# Patient Record
Sex: Male | Born: 1980 | Race: Black or African American | Hispanic: No | Marital: Single | Smoking: Former smoker
Health system: Southern US, Community
[De-identification: ages and names within clinical notes are randomized; demographics above are authoritative.]

## PROBLEM LIST (undated history)

## (undated) DIAGNOSIS — F419 Anxiety disorder, unspecified: Secondary | ICD-10-CM

## (undated) DIAGNOSIS — G43909 Migraine, unspecified, not intractable, without status migrainosus: Secondary | ICD-10-CM

## (undated) DIAGNOSIS — I10 Essential (primary) hypertension: Secondary | ICD-10-CM

## (undated) HISTORY — PX: KNEE SURGERY: SHX244

## (undated) HISTORY — DX: Anxiety disorder, unspecified: F41.9

---

## 2008-05-26 HISTORY — PX: OTHER SURGICAL HISTORY: SHX169

## 2009-06-03 ENCOUNTER — Emergency Department (HOSPITAL_BASED_OUTPATIENT_CLINIC_OR_DEPARTMENT_OTHER): Admission: EM | Admit: 2009-06-03 | Discharge: 2009-06-03 | Payer: Self-pay | Admitting: Emergency Medicine

## 2009-06-03 ENCOUNTER — Ambulatory Visit: Payer: Self-pay | Admitting: Diagnostic Radiology

## 2009-10-07 ENCOUNTER — Emergency Department (HOSPITAL_BASED_OUTPATIENT_CLINIC_OR_DEPARTMENT_OTHER): Admission: EM | Admit: 2009-10-07 | Discharge: 2009-10-07 | Payer: Self-pay | Admitting: Emergency Medicine

## 2009-11-08 ENCOUNTER — Emergency Department (HOSPITAL_BASED_OUTPATIENT_CLINIC_OR_DEPARTMENT_OTHER): Admission: EM | Admit: 2009-11-08 | Discharge: 2009-11-08 | Payer: Self-pay | Admitting: Emergency Medicine

## 2010-04-23 ENCOUNTER — Emergency Department (HOSPITAL_COMMUNITY): Admission: EM | Admit: 2010-04-23 | Discharge: 2010-04-23 | Payer: Self-pay | Admitting: Emergency Medicine

## 2010-04-24 ENCOUNTER — Emergency Department (HOSPITAL_COMMUNITY)
Admission: EM | Admit: 2010-04-24 | Discharge: 2010-04-24 | Payer: Self-pay | Source: Home / Self Care | Admitting: Emergency Medicine

## 2010-08-07 LAB — BASIC METABOLIC PANEL
BUN: 12 mg/dL (ref 6–23)
CO2: 26 mEq/L (ref 19–32)
Chloride: 107 mEq/L (ref 96–112)
Glucose, Bld: 104 mg/dL — ABNORMAL HIGH (ref 70–99)
Potassium: 3.8 mEq/L (ref 3.5–5.1)

## 2010-08-07 LAB — DIFFERENTIAL
Band Neutrophils: 0 % (ref 0–10)
Blasts: 0 %
Eosinophils Absolute: 0.1 10*3/uL (ref 0.0–0.7)
Metamyelocytes Relative: 0 %
Monocytes Absolute: 0.6 10*3/uL (ref 0.1–1.0)
Monocytes Relative: 11 % (ref 3–12)

## 2010-08-07 LAB — CBC
HCT: 44.8 % (ref 39.0–52.0)
MCH: 26.6 pg (ref 26.0–34.0)
MCV: 80.7 fL (ref 78.0–100.0)
RDW: 14 % (ref 11.5–15.5)
WBC: 5.4 10*3/uL (ref 4.0–10.5)

## 2010-08-24 ENCOUNTER — Emergency Department (HOSPITAL_COMMUNITY)
Admission: EM | Admit: 2010-08-24 | Discharge: 2010-08-24 | Disposition: A | Discharge status: Home / Self Care | Payer: Self-pay | Attending: Emergency Medicine | Admitting: Emergency Medicine

## 2010-08-24 DIAGNOSIS — J3489 Other specified disorders of nose and nasal sinuses: Secondary | ICD-10-CM | POA: Insufficient documentation

## 2010-08-24 DIAGNOSIS — R51 Headache: Secondary | ICD-10-CM | POA: Insufficient documentation

## 2011-08-11 ENCOUNTER — Emergency Department (HOSPITAL_BASED_OUTPATIENT_CLINIC_OR_DEPARTMENT_OTHER)
Admission: EM | Admit: 2011-08-11 | Discharge: 2011-08-11 | Disposition: A | Discharge status: Home / Self Care | Payer: Self-pay | Attending: Emergency Medicine | Admitting: Emergency Medicine

## 2011-08-11 ENCOUNTER — Encounter (HOSPITAL_BASED_OUTPATIENT_CLINIC_OR_DEPARTMENT_OTHER): Payer: Self-pay | Admitting: *Deleted

## 2011-08-11 DIAGNOSIS — F17200 Nicotine dependence, unspecified, uncomplicated: Secondary | ICD-10-CM | POA: Insufficient documentation

## 2011-08-11 DIAGNOSIS — B349 Viral infection, unspecified: Secondary | ICD-10-CM

## 2011-08-11 DIAGNOSIS — R6883 Chills (without fever): Secondary | ICD-10-CM | POA: Insufficient documentation

## 2011-08-11 MED ORDER — IBUPROFEN 800 MG PO TABS: 800.0000 mg | ORAL_TABLET | Freq: Three times a day (TID) | ORAL | Status: AC

## 2011-08-11 MED ORDER — IBUPROFEN 800 MG PO TABS
800.0000 mg | ORAL_TABLET | Freq: Once | ORAL | Status: AC
  Administered 2011-08-11: 800 mg via ORAL
  Filled 2011-08-11: qty 1

## 2011-08-11 MED ORDER — OSELTAMIVIR PHOSPHATE 75 MG PO CAPS: 75.0000 mg | ORAL_CAPSULE | Freq: Two times a day (BID) | ORAL | Status: AC

## 2011-08-11 NOTE — ED Provider Notes (Signed)
History     CSN: [id number]  Arrival date & time 08/11/11  1210   First MD Initiated Contact with Patient 08/11/11 1309      Chief Complaint  Patient presents with  . Chills    (Consider location/radiation/quality/duration/timing/severity/associated sxs/prior treatment) Patient is a 31 y.o. male presenting with cough. The history is provided by the patient. No language interpreter was used.  Cough This is a new problem. The current episode started yesterday. The problem occurs constantly. The problem has been gradually worsening. The cough is non-productive. The maximum temperature recorded prior to his arrival was 100 to 100.9 F. The fever has been present for less than 1 day. Associated symptoms include chills. He has tried nothing for the symptoms. 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 He is not a smoker. His past medical history does not include pneumonia or asthma.  Pt complains of feeling achy and having chills.  Pt reports he has not felt well since Thursday but began having cough and chills yesterday.  Pt complains of aching  History reviewed. No pertinent past medical history.  Past Surgical History  Procedure Date  . Knee surgery     History reviewed. No pertinent family history.  History  Substance Use Topics  . Smoking status: Current Some Day Smoker  . Smokeless tobacco: Not on file  . Alcohol Use: Yes     occ      Review of Systems  Constitutional: Positive for chills.  Respiratory: Positive for cough.   All other systems reviewed and are negative.    Allergies  Review of patient's allergies indicates no known allergies.  Home Medications  No current outpatient prescriptions on file.  BP 136/96  Pulse 97  Temp(Src) 99.4 F (37.4 C) (Oral)  Resp 18  Ht 6\' 4"  (1.93 m)  Wt 230 lb (104.327 kg)  BMI 28.00 kg/m2  SpO2 100%  Physical Exam  Nursing note and vitals reviewed. Constitutional: He is oriented to person, place, and time. He appears  well-developed and well-nourished.  HENT:  Head: Normocephalic and atraumatic.  Right Ear: External ear normal.  Nose: Nose normal.  Mouth/Throat: Oropharynx is clear and moist.  Eyes: Conjunctivae and EOM are normal. Pupils are equal, round, and reactive to light.  Neck: Normal range of motion. Neck supple.  Cardiovascular: Normal rate and normal heart sounds.   Pulmonary/Chest: Effort normal.  Abdominal: Soft.  Musculoskeletal: Normal range of motion.  Neurological: He is alert and oriented to person, place, and time. He has normal reflexes.  Skin: Skin is warm.  Psychiatric: He has a normal mood and affect.    ED Course  Procedures (including critical care time)  Labs Reviewed - No data to display No results found.   No diagnosis found.    MDM  Pt counseled I think illness is viral.  Pt did not have a flu shot.  He does want tamiflu       Lonia Skinner [place], [place] 08/11/11 1424

## 2011-08-11 NOTE — ED Notes (Signed)
Patient c/o weakness, and generally not feeling well.  Starting on Friday, worse yesterday.

## 2011-08-11 NOTE — ED Provider Notes (Signed)
Medical screening examination/treatment/procedure(s) were performed by non-physician practitioner and as supervising physician I was immediately available for consultation/collaboration.   Rolan Bucco, MD 08/11/11 [phone]

## 2011-08-11 NOTE — Discharge Instructions (Signed)
Viremia Your exam shows you have a viral illness. Viremia means your symptoms are due to the presence of the virus in your blood. This will often cause a chill or sweat. Other common symptoms of viral infections include fever, muscle aches, headache, fatigue, stomach upsets, sore throat, and dry cough. Antibiotics are not effective in viral illnesses; they are usually only given when there is a secondary bacterial infection. General treatment includes bed rest, increasing oral fluid intake of clear, non-caffeinated drinks like ginger ale, fruit juices, water, or sports drinks. Medicines to relieve specific symptoms such as cough, pain, or diarrhea may also be prescribed. Only take over-the-counter or prescription medicines for pain, discomfort, or fever as directed by your caregiver.  Please call your doctor if you are not better after 2 to 3 days of symptom treatment. Call or return here right away if your illness gets more severe, or you develop any other new symptoms, such as a fever above 103 F (39.4 C), vomiting for more than a day, severe headache or other pain, stiff neck, trouble breathing, visual problems, "blackouts" or fainting. Document Released: 06/19/2004 Document Revised: 05/01/2011 Document Reviewed: 05/12/2005 [employer] Patient Information 2012 [employer]. 

## 2013-05-03 ENCOUNTER — Encounter (HOSPITAL_BASED_OUTPATIENT_CLINIC_OR_DEPARTMENT_OTHER): Payer: Self-pay | Admitting: Emergency Medicine

## 2013-05-03 ENCOUNTER — Emergency Department (HOSPITAL_BASED_OUTPATIENT_CLINIC_OR_DEPARTMENT_OTHER)
Admission: EM | Admit: 2013-05-03 | Discharge: 2013-05-03 | Disposition: A | Discharge status: Home / Self Care | Payer: BC Managed Care – PPO | Attending: Emergency Medicine | Admitting: Emergency Medicine

## 2013-05-03 DIAGNOSIS — F17200 Nicotine dependence, unspecified, uncomplicated: Secondary | ICD-10-CM | POA: Insufficient documentation

## 2013-05-03 DIAGNOSIS — H6690 Otitis media, unspecified, unspecified ear: Secondary | ICD-10-CM | POA: Insufficient documentation

## 2013-05-03 DIAGNOSIS — H6691 Otitis media, unspecified, right ear: Secondary | ICD-10-CM

## 2013-05-03 DIAGNOSIS — J3489 Other specified disorders of nose and nasal sinuses: Secondary | ICD-10-CM | POA: Insufficient documentation

## 2013-05-03 MED ORDER — AMOXICILLIN 500 MG PO CAPS: 500.0000 mg | ORAL_CAPSULE | Freq: Three times a day (TID) | ORAL | Status: DC

## 2013-05-03 NOTE — ED Provider Notes (Signed)
CSN: [id number]     Arrival date & time 05/03/13  1531 History   First MD Initiated Contact with Patient 05/03/13 1537     Chief Complaint  Patient presents with  . Otalgia   (Consider location/radiation/quality/duration/timing/severity/associated sxs/prior Treatment) HPI Comments: Patient is a 32 year old male presents with complaints of right ear discomfort for the past several days. He states that when he tilts his head to the side his headache gets worse in his hearing goes away. When he straightens his head back up his hearing returns. He states that he can feel something moving around inside of his ear. He reports a recent upper respiratory infection but denies fevers. He denies any visual changes, neck pain, high fever, or other symptoms.  Patient is a 32 y.o. male presenting with ear pain. The history is provided by the patient.  Otalgia Location:  Right Behind ear:  No abnormality Quality:  Pressure Severity:  Moderate Onset quality:  Gradual Duration:  4 days Timing:  Constant Progression:  Worsening Chronicity:  New Relieved by:  Nothing Worsened by:  Nothing tried Ineffective treatments:  None tried Associated symptoms: congestion, ear discharge and hearing loss   Associated symptoms: no fever     History reviewed. No pertinent past medical history. Past Surgical History  Procedure Laterality Date  . Knee surgery     No family history on file. History  Substance Use Topics  . Smoking status: Current Some Day Smoker  . Smokeless tobacco: Not on file  . Alcohol Use: Yes     Comment: occ    Review of Systems  Constitutional: Negative for fever.  HENT: Positive for congestion, ear discharge, ear pain and hearing loss.   All other systems reviewed and are negative.    Allergies  Review of patient's allergies indicates no known allergies.  Home Medications  No current outpatient prescriptions on file. BP 138/96  Pulse 65  Temp(Src) 99.1 F (37.3 C)  (Oral)  Resp 16  Ht 6\' 4"  (1.93 m)  Wt 230 lb (104.327 kg)  BMI 28.01 kg/m2  SpO2 100% Physical Exam  Nursing note and vitals reviewed. Constitutional: He is oriented to person, place, and time. He appears well-developed and well-nourished. No distress.  HENT:  Head: Normocephalic and atraumatic.  Mouth/Throat: Oropharynx is clear and moist.  The right tympanic membrane is noted to be erythematous with fluid in the middle ear.  Eyes: EOM are normal. Pupils are equal, round, and reactive to light.  Neck: Normal range of motion. Neck supple.  Cardiovascular: Normal rate, regular rhythm and normal heart sounds.   No murmur heard. Pulmonary/Chest: Effort normal and breath sounds normal. No respiratory distress. He has no wheezes.  Musculoskeletal: Normal range of motion. He exhibits no edema.  Neurological: He is alert and oriented to person, place, and time. No cranial nerve deficit. He exhibits normal muscle tone. Coordination normal.  Skin: Skin is warm and dry. He is not diaphoretic.    ED Course  Procedures (including critical care time) Labs Review Labs Reviewed - No data to display Imaging Review No results found.    MDM  No diagnosis found. This appears to be a mixed acute and serous otitis media. Will treat with amoxicillin and recommend decongestants.    Geoffery Lyons, MD 05/03/13 [phone]

## 2013-05-03 NOTE — ED Notes (Signed)
Headache and right ear pain.

## 2013-05-07 ENCOUNTER — Encounter (HOSPITAL_BASED_OUTPATIENT_CLINIC_OR_DEPARTMENT_OTHER): Payer: Self-pay | Admitting: Emergency Medicine

## 2013-05-07 ENCOUNTER — Emergency Department (HOSPITAL_BASED_OUTPATIENT_CLINIC_OR_DEPARTMENT_OTHER)
Admission: EM | Admit: 2013-05-07 | Discharge: 2013-05-07 | Disposition: A | Discharge status: Home / Self Care | Payer: BC Managed Care – PPO | Attending: Emergency Medicine | Admitting: Emergency Medicine

## 2013-05-07 DIAGNOSIS — Z8709 Personal history of other diseases of the respiratory system: Secondary | ICD-10-CM | POA: Insufficient documentation

## 2013-05-07 DIAGNOSIS — Z792 Long term (current) use of antibiotics: Secondary | ICD-10-CM | POA: Insufficient documentation

## 2013-05-07 DIAGNOSIS — F17200 Nicotine dependence, unspecified, uncomplicated: Secondary | ICD-10-CM | POA: Insufficient documentation

## 2013-05-07 DIAGNOSIS — G43909 Migraine, unspecified, not intractable, without status migrainosus: Secondary | ICD-10-CM

## 2013-05-07 MED ORDER — SODIUM CHLORIDE 0.9 % IV SOLN
INTRAVENOUS | Status: DC
  Administered 2013-05-07: 14:00:00 via INTRAVENOUS

## 2013-05-07 MED ORDER — DEXAMETHASONE SODIUM PHOSPHATE 10 MG/ML IJ SOLN
10.0000 mg | Freq: Once | INTRAMUSCULAR | Status: AC
  Administered 2013-05-07: 10 mg via INTRAVENOUS
  Filled 2013-05-07: qty 1

## 2013-05-07 MED ORDER — METOCLOPRAMIDE HCL 5 MG/ML IJ SOLN
10.0000 mg | Freq: Once | INTRAMUSCULAR | Status: AC
  Administered 2013-05-07: 10 mg via INTRAVENOUS
  Filled 2013-05-07: qty 2

## 2013-05-07 MED ORDER — DIPHENHYDRAMINE HCL 50 MG/ML IJ SOLN
25.0000 mg | Freq: Once | INTRAMUSCULAR | Status: AC
  Administered 2013-05-07: 25 mg via INTRAVENOUS
  Filled 2013-05-07: qty 1

## 2013-05-07 NOTE — ED Notes (Addendum)
Was given antibiotic to clear up infection behind ear drum, still taking antibiotics, pain behind ear gone, pain back or ear radiating to right eye, pt states h/o mastoiditis in past with similar headaches

## 2013-05-07 NOTE — ED Notes (Signed)
Patient here for 2nd visit of ongoing right sided headache. Patient was prescribed amoxicillin and taking with no relief. Denies nausea, no vision changes.

## 2013-05-07 NOTE — ED Notes (Signed)
D/c home- no new rx given 

## 2013-05-07 NOTE — ED Provider Notes (Signed)
Medical screening examination/treatment/procedure(s) were performed by non-physician practitioner and as supervising physician I was immediately available for consultation/collaboration.  Kathie Posa M Daelan Gatt, MD 05/07/13 2021 

## 2013-05-07 NOTE — ED Provider Notes (Signed)
CSN: [id number]     Arrival date & time 05/07/13  1203 History   First MD Initiated Contact with Patient 05/07/13 1318     Chief Complaint  Patient presents with  . Headache   (Consider location/radiation/quality/duration/timing/severity/associated sxs/prior Treatment) Patient is a 32 y.o. male presenting with migraines.  Migraine The problem occurs constantly. The problem has been unchanged. Associated symptoms include headaches, nausea and a visual change. Pertinent negatives include no abdominal pain, anorexia, chills, congestion, coughing, fever, myalgias, rash, sore throat or vomiting. He has tried NSAIDs, oral narcotics, rest, sleep and position changes for the symptoms. The treatment provided mild relief.   Ross Ryan is a 32 y.o. male who presents to the ED with right side headache. He was evaluated a few days ago and treated with Amoxicillin for sinusitis. Today the headache is on the right side of his head around his right eye. It feels similar to headaches he has had in the past with migraines. He denies neck pain or stiffness, fever or chills. He has nausea but no vomiting.    History reviewed. No pertinent past medical history. Past Surgical History  Procedure Laterality Date  . Knee surgery     No family history on file. History  Substance Use Topics  . Smoking status: Current Some Day Smoker  . Smokeless tobacco: Not on file  . Alcohol Use: Yes     Comment: occ    Review of Systems  Constitutional: Negative for fever and chills.  HENT: Negative for congestion and sore throat.   Eyes: Negative for photophobia and pain.  Respiratory: Negative for cough and shortness of breath.   Gastrointestinal: Positive for nausea. Negative for vomiting, abdominal pain and anorexia.  Genitourinary: Negative for dysuria.  Musculoskeletal: Negative for back pain and myalgias.  Skin: Negative for rash.  Allergic/Immunologic: Negative for immunocompromised state.  Neurological:  Positive for headaches. Negative for dizziness and light-headedness.  Psychiatric/Behavioral: The patient is not nervous/anxious.     Allergies  Review of patient's allergies indicates no known allergies.  Home Medications   Current Outpatient Rx  Name  Route  Sig  Dispense  Refill  . amoxicillin (AMOXIL) 500 MG capsule   Oral   Take 1 capsule (500 mg total) by mouth 3 (three) times daily.   21 capsule   0    BP 142/93  Pulse 68  Temp(Src) 98.2 F (36.8 C)  Resp 16  SpO2 100% Physical Exam  Nursing note and vitals reviewed. Constitutional: He is oriented to person, place, and time. He appears well-developed and well-nourished. No distress.  HENT:  Head: Normocephalic and atraumatic.  Right Ear: Tympanic membrane normal.  Left Ear: Tympanic membrane normal.  Nose: Nose normal.  Mouth/Throat: Uvula is midline, oropharynx is clear and moist and mucous membranes are normal.  Eyes: EOM are normal.  Neck: Neck supple.  Cardiovascular: Normal rate and regular rhythm.   Pulmonary/Chest: Effort normal and breath sounds normal.  Abdominal: Soft. There is no tenderness.  Musculoskeletal: Normal range of motion.  Pedal and radial pulses strong, adequate circulation, good touch sensation. Full range of motion.   Neurological: He is alert and oriented to person, place, and time. He has normal strength and normal reflexes. No cranial nerve deficit or sensory deficit. Coordination and gait normal.  Skin: Skin is warm and dry.  Psychiatric: He has a normal mood and affect. His behavior is normal. Judgment and thought content normal.     @ 1520 headache completely gone  after Decadron 10 mg, Reglan 10 mg and Benadryl 25 mg IV with Normal saline IV ED Course  Procedures   MDM  32 y.o. male with right side headache resolved with IV medications and IV fluids. Stable for discharge without any immediate complications. Normal neuro exam. Discussed with the patient clinical findings and plan  of care. All questioned fully answered. He will return if any problems arise.     Janne Napoleon, [place] 05/08/13 [phone]

## 2014-07-30 ENCOUNTER — Encounter (HOSPITAL_BASED_OUTPATIENT_CLINIC_OR_DEPARTMENT_OTHER): Payer: Self-pay | Admitting: *Deleted

## 2014-07-30 ENCOUNTER — Emergency Department (HOSPITAL_BASED_OUTPATIENT_CLINIC_OR_DEPARTMENT_OTHER)
Admission: EM | Admit: 2014-07-30 | Discharge: 2014-07-30 | Disposition: A | Discharge status: Home / Self Care | Payer: Self-pay | Attending: Emergency Medicine | Admitting: Emergency Medicine

## 2014-07-30 DIAGNOSIS — Z792 Long term (current) use of antibiotics: Secondary | ICD-10-CM | POA: Insufficient documentation

## 2014-07-30 DIAGNOSIS — K088 Other specified disorders of teeth and supporting structures: Secondary | ICD-10-CM | POA: Insufficient documentation

## 2014-07-30 DIAGNOSIS — Z720 Tobacco use: Secondary | ICD-10-CM | POA: Insufficient documentation

## 2014-07-30 DIAGNOSIS — K0889 Other specified disorders of teeth and supporting structures: Secondary | ICD-10-CM

## 2014-07-30 MED ORDER — BUPIVACAINE-EPINEPHRINE (PF) 0.5% -1:200000 IJ SOLN
1.8000 mL | Freq: Once | INTRAMUSCULAR | Status: AC
  Administered 2014-07-30: 1.8 mL
  Filled 2014-07-30: qty 1.8

## 2014-07-30 MED ORDER — NAPROXEN 500 MG PO TABS: 500.0000 mg | ORAL_TABLET | Freq: Two times a day (BID) | ORAL | Status: DC

## 2014-07-30 MED ORDER — PENICILLIN V POTASSIUM 500 MG PO TABS: 500.0000 mg | ORAL_TABLET | Freq: Four times a day (QID) | ORAL | Status: AC

## 2014-07-30 MED ORDER — HYDROCODONE-ACETAMINOPHEN 5-325 MG PO TABS: 1.0000 | ORAL_TABLET | ORAL | Status: DC | PRN

## 2014-07-30 NOTE — ED Notes (Signed)
NP at bedside for nerve block.  Supplies to bedside.

## 2014-07-30 NOTE — Discharge Instructions (Signed)
Please follow the directions provided. It is very important free to follow-up with the dentist for treatment of this sore tooth. Please take your antibiotics as directed until they are all gone. Please take the naproxen twice a day. You may take Vicodin for pain not relieved by the naproxen. Don't hesitate to return for any new, worsening, or concerning symptoms.    SEEK IMMEDIATE MEDICAL CARE IF:  You have a fever.  You develop redness and swelling of your face, jaw, or neck.  You are unable to open your mouth.  You have severe pain uncontrolled by pain medicine.

## 2014-07-30 NOTE — ED Notes (Addendum)
Patient states the left jaw has been swollen since yesterday, states he also has pain along his teeth on the bottom. States he has broken teeth on that side.

## 2014-07-30 NOTE — ED Provider Notes (Signed)
CSN: [id number]     Arrival date & time 07/30/14  1223 History   First MD Initiated Contact with Patient 07/30/14 1308     Chief Complaint  Patient presents with  . Facial Swelling   (Consider location/radiation/quality/duration/timing/severity/associated sxs/prior Treatment) HPI  Ross Ryan is a 34 year old male presenting with dental pain and facial swelling. He states a tooth on his left lower jaw began to hurt him about a month ago. He noticed he started having left-sided facial swelling that began yesterday. He rates his pain as 7 out of 10 and describes it as aching. He denies any trouble drinking fluids, fevers nausea or vomiting.     No past medical history on file. Past Surgical History  Procedure Laterality Date  . Knee surgery     No family history on file. History  Substance Use Topics  . Smoking status: Current Some Day Smoker  . Smokeless tobacco: Not on file  . Alcohol Use: Yes     Comment: occ    Review of Systems  Constitutional: Negative for fever.  HENT: Positive for dental problem.   Gastrointestinal: Negative for nausea and vomiting.      Allergies  Review of patient's allergies indicates no known allergies.  Home Medications   Prior to Admission medications   Medication Sig Start Date End Date Taking? Authorizing Provider  amoxicillin (AMOXIL) 500 MG capsule Take 1 capsule (500 mg total) by mouth 3 (three) times daily. 05/03/13   Geoffery Lyonsouglas Delo, MD   BP 135/90 mmHg  Pulse 80  Temp(Src) 98.8 F (37.1 C) (Oral)  Resp 16  Ht 6\' 4"  (1.93 m)  Wt 210 lb (95.255 kg)  BMI 25.57 kg/m2  SpO2 100% Physical Exam  Constitutional: He appears well-developed and well-nourished. No distress.  HENT:  Head: Normocephalic and atraumatic.  Mouth/Throat:    Tenderness to left lower molar. Mild facial swelling to soft tissues of left lower jaw, but no abscess noted, not trismus and no oral floor swelling.   Eyes: Conjunctivae are normal. Right eye  exhibits no discharge. Left eye exhibits no discharge. No scleral icterus.  Cardiovascular: Intact distal pulses.   Pulmonary/Chest: Effort normal.  Neurological: He is alert. Coordination normal.  Skin: He is not diaphoretic.  Nursing note and vitals reviewed.   ED Course  Procedures (including critical care time) NERVE BLOCK Performed by: Harle Battiestysinger, Scherry Laverne Consent: Verbal consent obtained. Required items: required blood products, implants, devices, and special equipment available Time out: Immediately prior to procedure a "time out" was called to verify the correct patient, procedure, equipment, support staff and site/side marked as required.  Indication: dental pain Nerve block body site: inferior alveolar nerve  Preparation: Patient was prepped and draped in the usual sterile fashion. Needle gauge: 27 G Location technique: anatomical landmarks  Local anesthetic: bupivicaine with epi  Anesthetic total: 1.8 ml  Outcome: pain improved Patient tolerance: Patient tolerated the procedure well with no immediate complications.   Labs Review Labs Reviewed - No data to display  Imaging Review No results found.   EKG Interpretation None      MDM   Final diagnoses:  Pain, dental   34 yo with toothache but no signs of deep space infection or gross abscess. Pain resolved after dental block. Prescription for penicillin and pain medicine provided.  Resources provided to establish care with a  Dentist for definitive treatment.     Filed Vitals:   07/30/14 1232 07/30/14 1432  BP: 135/90 140/80  Pulse: 80  72  Temp: 98.8 F (37.1 C)   TempSrc: Oral   Resp: 16 16  Height:  (1.93 m)   Weight: 210 lb (95.255 kg)   SpO2: 100% 100%   Meds given in ED:  Medications  bupivacaine-epinephrine (MARCAINE W/ EPI) 0.5% -1:200000 injection 1.8 mL (1.8 mLs Infiltration Given by Other 07/30/14 1409)    Discharge Medication List as of 07/30/2014  2:24 PM    START taking these  medications   Details  HYDROcodone-acetaminophen (NORCO/VICODIN) 5-325 MG per tablet Take 1 tablet by mouth every 4 (four) hours as needed., Starting 07/30/2014, Until Discontinued, Print    naproxen (NAPROSYN) 500 MG tablet Take 1 tablet (500 mg total) by mouth 2 (two) times daily., Starting 07/30/2014, Until Discontinued, Print    penicillin v potassium (VEETID) 500 MG tablet Take 1 tablet (500 mg total) by mouth 4 (four) times daily., Starting 07/30/2014, Until Sun 08/06/14, Print           Harle Battiest, NP 08/01/14 1610  Geoffery Lyons, MD 08/03/14 [phone]

## 2015-01-11 ENCOUNTER — Encounter (HOSPITAL_BASED_OUTPATIENT_CLINIC_OR_DEPARTMENT_OTHER): Payer: Self-pay | Admitting: *Deleted

## 2015-01-11 ENCOUNTER — Emergency Department (HOSPITAL_BASED_OUTPATIENT_CLINIC_OR_DEPARTMENT_OTHER)
Admission: EM | Admit: 2015-01-11 | Discharge: 2015-01-12 | Disposition: A | Discharge status: Home / Self Care | Payer: Self-pay | Attending: Emergency Medicine | Admitting: Emergency Medicine

## 2015-01-11 DIAGNOSIS — R21 Rash and other nonspecific skin eruption: Secondary | ICD-10-CM | POA: Insufficient documentation

## 2015-01-11 DIAGNOSIS — Z720 Tobacco use: Secondary | ICD-10-CM | POA: Insufficient documentation

## 2015-01-11 DIAGNOSIS — Z872 Personal history of diseases of the skin and subcutaneous tissue: Secondary | ICD-10-CM | POA: Insufficient documentation

## 2015-01-11 DIAGNOSIS — Z791 Long term (current) use of non-steroidal anti-inflammatories (NSAID): Secondary | ICD-10-CM | POA: Insufficient documentation

## 2015-01-11 DIAGNOSIS — Z792 Long term (current) use of antibiotics: Secondary | ICD-10-CM | POA: Insufficient documentation

## 2015-01-11 MED ORDER — PREDNISONE 50 MG PO TABS
60.0000 mg | ORAL_TABLET | Freq: Once | ORAL | Status: AC
  Administered 2015-01-12: 60 mg via ORAL
  Filled 2015-01-11 (×2): qty 1

## 2015-01-11 MED ORDER — DIPHENHYDRAMINE HCL 25 MG PO CAPS
50.0000 mg | ORAL_CAPSULE | Freq: Once | ORAL | Status: AC
  Administered 2015-01-12: 50 mg via ORAL
  Filled 2015-01-11: qty 2

## 2015-01-11 NOTE — ED Provider Notes (Signed)
This chart was scribed for Layla Maw Heraclio Seidman, DO by Phillis Haggis, ED Scribe. This patient was seen in room MH05/MH05 and patient care was started at 11:50 PM.   TIME SEEN: 11:50 PM  CHIEF COMPLAINT: allergic reaction  HPI:  Ross Ryan is a 34 y.o. male who presents to the Emergency Department complaining of diffuse pruritic rash that started yesterday. States that 2 days ago he began feeling "hot and cold", tired. He states he has a history of eczema and noticed that his eczema on his right lateral leg was getting worse. He thought this could be an infection so he started taking amoxicillin that he had leftover from her previous visit for dental pain.  States he has taken penicillin in the past without difficulty. Denies any lip or tongue swelling. No wheezing or shortness of breath. No lightheadedness. No other new soaps, lotions, detergents or medications. He has not measured his temperature at home. Afebrile in the emergency department and did not take antibiotics prior to arrival. Denies cough, sore throat, ear pain, vomiting, diarrhea. No tick bite. No recent travel or sick contact.   ROS: See HPI Constitutional:  Subjective fever  Eyes: no drainage  ENT: no runny nose   Cardiovascular:  no chest pain  Resp: no SOB  GI: no vomiting GU: no dysuria Integumentary:rash  Allergy: hives  Musculoskeletal: no leg swelling  Neurological: no slurred speech ROS otherwise negative  PAST MEDICAL HISTORY/PAST SURGICAL HISTORY:  History reviewed. No pertinent past medical history.  MEDICATIONS:  Prior to Admission medications   Medication Sig Start Date End Date Taking? Authorizing Provider  amoxicillin (AMOXIL) 500 MG capsule Take 1 capsule (500 mg total) by mouth 3 (three) times daily. 05/03/13   Geoffery Lyons, MD  HYDROcodone-acetaminophen (NORCO/VICODIN) 5-325 MG per tablet Take 1 tablet by mouth every 4 (four) hours as needed. 07/30/14   Harle Battiest, NP  naproxen (NAPROSYN) 500 MG  tablet Take 1 tablet (500 mg total) by mouth 2 (two) times daily. 07/30/14   Harle Battiest, NP    ALLERGIES:  No Known Allergies  SOCIAL HISTORY:  Social History  Substance Use Topics  . Smoking status: Current Some Day Smoker  . Smokeless tobacco: Not on file  . Alcohol Use: Yes     Comment: occ    FAMILY HISTORY: No family history on file.  EXAM: BP 143/86 mmHg  Pulse 86  Temp(Src) 99.6 F (37.6 C) (Oral)  Resp 20  Ht 6\' 4"  (1.93 m)  Wt 220 lb (99.791 kg)  BMI 26.79 kg/m2  SpO2 100%  CONSTITUTIONAL: Alert and oriented and responds appropriately to questions. Well-appearing; well-nourished, afebrile, nontoxic, in no distress HEAD: Normocephalic EYES: Conjunctivae clear, PERRL ENT: normal nose; no rhinorrhea; moist mucous membranes; pharynx without lesions noted or tonsillar hypertrophy or exudate, no uvular deviation, no angioedema, no Ludwig's angina, no sign of dental abscess, normal phonation, no stridor, swallowing his secretions without difficulty NECK: Supple, no meningismus, no LAD  CARD: RRR; S1 and S2 appreciated; no murmurs, no clicks, no rubs, no gallops RESP: Normal chest excursion without splinting or tachypnea; breath sounds clear and equal bilaterally; no wheezes, no rhonchi, no rales, no hypoxia or respiratory distress, speaking full sentences ABD/GI: Normal bowel sounds; non-distended; soft, non-tender, no rebound, no guarding, no peritoneal signs BACK:  The back appears normal and is non-tender to palpation, there is no CVA tenderness EXT: Normal ROM in all joints; non-tender to palpation; no edema; normal capillary refill; no cyanosis, no calf  tenderness or swelling    SKIN: Normal color for age and race; warm; diffuse erythematous scattered macular rash to his torso and extremities, no rash involving palms or mucus membranes, no petechiae or purpura, no desquamation or blisters, no hives; eczematous lesions to the right lateral ankle without sign of  superimposed infection, no fluctuance or induration NEURO: Moves all extremities equally, sensation to light touch intact diffusely, cranial nerves II through XII intact PSYCH: The patient's mood and manner are appropriate. Grooming and personal hygiene are appropriate.  MEDICAL DECISION MAKING: Patient here with likely drug rash. Have advised him to stop taking amoxicillin. He reports subjective fevers at home and not feeling well. No other associated symptoms. Doubt meningitis, pneumonia. Rash does not appear to be infectious in nature. No tick bite. No signs of life-threatening rash. We'll discharge home on steroid taper and have advised him to use Benadryl as needed for itching. Discussed usual and customary return precautions. He verbalizes understanding and is comfortable with plan.    I personally performed the services described in this documentation, which was scribed in my presence. The recorded information has been reviewed and is accurate.     Layla Maw Duchess Armendarez, DO 01/12/15 [phone]

## 2015-01-11 NOTE — ED Notes (Signed)
Pt was taking antibiotics for a rash (x2 doses)  Pt took 2 doses and isnt feeling well.  Pt states that he feels sweaty, weak, itching, hives.  No sob or swelling in mouth or throat at this time.  Pt did not take any Benadryl at home

## 2015-01-12 MED ORDER — PREDNISONE 10 MG PO TABS: 60.0000 mg | ORAL_TABLET | Freq: Every day | ORAL | Status: DC

## 2015-01-12 NOTE — Discharge Instructions (Signed)
I suspect that your rash is due to a drug allergy to amoxicillin. Please stop taking this medication. I recommend that you avoid penicillin in the future. Use over-the-counter Benadryl 50 mg every 6 hours over-the-counter. You may use this as needed for itching.  Rash A rash is a change in the color or texture of your skin. There are many different types of rashes. You may have other problems that accompany your rash. CAUSES   Infections.  Allergic reactions. This can include allergies to pets or foods.  Certain medicines.  Exposure to certain chemicals, soaps, or cosmetics.  Heat.  Exposure to poisonous plants.  Tumors, both cancerous and noncancerous. SYMPTOMS   Redness.  Scaly skin.  Itchy skin.  Dry or cracked skin.  Bumps.  Blisters.  Pain. DIAGNOSIS  Your caregiver may do a physical exam to determine what type of rash you have. A skin sample (biopsy) may be taken and examined under a microscope. TREATMENT  Treatment depends on the type of rash you have. Your caregiver may prescribe certain medicines. For serious conditions, you may need to see a skin doctor (dermatologist). HOME CARE INSTRUCTIONS   Avoid the substance that caused your rash.  Do not scratch your rash. This can cause infection.  You may take cool baths to help stop itching.  Only take over-the-counter or prescription medicines as directed by your caregiver.  Keep all follow-up appointments as directed by your caregiver. SEEK IMMEDIATE MEDICAL CARE IF:  You have increasing pain, swelling, or redness.  You have a fever.  You have new or severe symptoms.  You have body aches, diarrhea, or vomiting.  Your rash is not better after 3 days. MAKE SURE YOU:  Understand these instructions.  Will watch your condition.  Will get help right away if you are not doing well or get worse. Document Released: 05/02/2002 Document Revised: 08/04/2011 Document Reviewed: 02/24/2011 [facility]  Patient Information 2015 [place], [place]. This information is not intended to replace advice given to you by your health care provider. Make sure you discuss any questions you have with your health care provider.  Drug Allergy A drug allergy means you have a strange reaction to a medicine. You may have puffiness (swelling), itching, red rashes, and hives. Some allergic reactions can be life-threatening. HOME CARE  If you do not know what caused your reaction:  Write down medicines you use.  Write down any problems you have after using medicine.  Avoid things that cause a reaction.  You can see an allergy doctor to be tested for allergies. If you have hives or a rash:  Take medicine as told by your doctor.  Place cold cloths on your skin.  Do not take hot baths or hot showers. Take baths in cool water. If you are severely allergic:  Wear a medical bracelet or necklace that lists your allergy.  Carry your allergy kit or medicine shot to treat severe allergic reactions with you. These can save your life.  Do not drive until medicine from your shot has worn off, unless your doctor says it is okay. GET HELP RIGHT AWAY IF:   Your mouth is puffy, or you have trouble breathing.  You have a tight feeling in your chest or throat.  You have hives, puffiness, or itching all over your body.  You throw up (vomit) or have watery poop (diarrhea).  You feel dizzy or pass out (faint).  You think you are having a reaction. Problems often start within 30 minutes  after taking a medicine.  You are getting worse, not better.  You have new problems.  Your problems go away and then come back. This is an emergency. Use your medicine shot or allergy kit as told. Call yourlocal emergency services (911 in U.S.) after the shot. Even if you feel better after the shot, you need to go to the hospital. You may need more medicine to control a severe reaction. MAKE SURE YOU:  Understand these  instructions.  Will watch your condition.  Will get help right away if you are not doing well or get worse. Document Released: 06/19/2004 Document Revised: 08/04/2011 Document Reviewed: 11/07/2010 [facility] Patient Information 2015 [place], [place]. This information is not intended to replace advice given to you by your health care provider. Make sure you discuss any questions you have with your health care provider.

## 2015-10-07 ENCOUNTER — Encounter (HOSPITAL_BASED_OUTPATIENT_CLINIC_OR_DEPARTMENT_OTHER): Payer: Self-pay | Admitting: Emergency Medicine

## 2015-10-07 ENCOUNTER — Emergency Department (HOSPITAL_BASED_OUTPATIENT_CLINIC_OR_DEPARTMENT_OTHER)
Admission: EM | Admit: 2015-10-07 | Discharge: 2015-10-07 | Disposition: A | Discharge status: Home / Self Care | Payer: Self-pay | Attending: Emergency Medicine | Admitting: Emergency Medicine

## 2015-10-07 DIAGNOSIS — F17200 Nicotine dependence, unspecified, uncomplicated: Secondary | ICD-10-CM | POA: Insufficient documentation

## 2015-10-07 DIAGNOSIS — L989 Disorder of the skin and subcutaneous tissue, unspecified: Secondary | ICD-10-CM | POA: Insufficient documentation

## 2015-10-07 NOTE — ED Notes (Signed)
EDP into room 

## 2015-10-07 NOTE — ED Notes (Signed)
Patient reports that he had a "mole" to the back of his head, and it started to bleed earlier today and then has started again x 3 times. Bleeding is controlled at this time

## 2015-10-07 NOTE — ED Provider Notes (Signed)
CSN: [id number]     Arrival date & time 10/07/15  2001 History  By signing my name below, I, Bethel BornBritney McCollum, attest that this documentation has been prepared under the direction and in the presence of Melene Planan Aydien Majette, DO. Electronically Signed: Bethel BornBritney McCollum, ED Scribe. 10/07/2015. 9:17 PM   Chief Complaint  Patient presents with  . Recurrent Skin Infections    The history is provided by the patient. No language interpreter was used.   Georgian Coatrick R Taha is a 35 y.o. male who presents to the Emergency Department complaining of intermittent bleeding from a lesion at the posterior scalp with onset last night. Pt states that the area has been there for a few months and he thought it was a hair bump Since last night he has had 2 other episodes of bleeding. Pt reports that there was a significant amount of bleeding.  He has no pain at the affected area. There are no similar areas on his body.    History reviewed. No pertinent past medical history. Past Surgical History  Procedure Laterality Date  . Knee surgery     History reviewed. No pertinent family history. Social History  Substance Use Topics  . Smoking status: Current Some Day Smoker  . Smokeless tobacco: None  . Alcohol Use: Yes     Comment: occ    Review of Systems  Constitutional: Negative for fever and chills.  HENT: Negative for congestion and facial swelling.   Eyes: Negative for discharge and visual disturbance.  Respiratory: Negative for shortness of breath.   Cardiovascular: Negative for chest pain and palpitations.  Gastrointestinal: Negative for vomiting, abdominal pain and diarrhea.  Musculoskeletal: Negative for myalgias and arthralgias.  Skin: Positive for wound. Negative for color change and rash.  Neurological: Negative for tremors, syncope and headaches.  Psychiatric/Behavioral: Negative for confusion and dysphoric mood.  All other systems reviewed and are negative.  Allergies  Review of patient's allergies  indicates no known allergies.  Home Medications   Prior to Admission medications   Medication Sig Start Date End Date Taking? Authorizing Provider  amoxicillin (AMOXIL) 500 MG capsule Take 1 capsule (500 mg total) by mouth 3 (three) times daily. 05/03/13   Geoffery Lyonsouglas Delo, MD  HYDROcodone-acetaminophen (NORCO/VICODIN) 5-325 MG per tablet Take 1 tablet by mouth every 4 (four) hours as needed. 07/30/14   Harle BattiestElizabeth Tysinger, NP  naproxen (NAPROSYN) 500 MG tablet Take 1 tablet (500 mg total) by mouth 2 (two) times daily. 07/30/14   Harle BattiestElizabeth Tysinger, NP  predniSONE (DELTASONE) 10 MG tablet Take 6 tablets (60 mg total) by mouth daily. Take 60 mg x 1 day then 50 mg x 1 day then 40 mg x 1 day then 30 mg x 1 day then 20 mg x 1 day then 10 mg x 1 day then stop 01/12/15   Kristen N Ward, DO   BP 144/93 mmHg  Pulse 65  Temp(Src) 99 F (37.2 C) (Oral)  Resp 18  Ht 6\' 1"  (1.854 m)  Wt 220 lb (99.791 kg)  BMI 29.03 kg/m2  SpO2 100% Physical Exam  Constitutional: He is oriented to person, place, and time. He appears well-developed and well-nourished.  HENT:  Head: Normocephalic.  Pencil eraser sized hyperpigmented lesion with a very small scab at the tip  No active bleeding No pulsation Blanchable Non-tender to touch   Eyes: EOM are normal. Pupils are equal, round, and reactive to light.  Neck: Normal range of motion. Neck supple. No JVD present.  Cardiovascular: Normal  rate and regular rhythm.  Exam reveals no gallop and no friction rub.   No murmur heard. Pulmonary/Chest: No respiratory distress. He has no wheezes.  Abdominal: He exhibits no distension. There is no rebound and no guarding.  Musculoskeletal: Normal range of motion.  Neurological: He is alert and oriented to person, place, and time.  Skin: No rash noted. No pallor.  Psychiatric: He has a normal mood and affect. His behavior is normal.  Nursing note and vitals reviewed.   ED Course  Procedures (including critical care  time) DIAGNOSTIC STUDIES: Oxygen Saturation is 100% on RA,  normal by my interpretation.    COORDINATION OF CARE: 9:11 PM Discussed treatment plan which includes discharge to f/u with ENT or dermatology with pt at bedside and pt agreed to plan.  Labs Review Labs Reviewed - No data to display  Imaging Review No results found.   EKG Interpretation None      MDM   Final diagnoses:  Skin lesion    35 yo M Lesion to the right parietal region.  Difficult to tell exactly what lesion is slightly darker pigmentation, easily bleeding per patient.  Possibly keloid vs nevus vs avm.  Not bleeding on my exam. Derm follow up given.   9:33 PM:  I have discussed the diagnosis/risks/treatment options with the patient and believe the pt to be eligible for discharge home to follow-up with Derm. We also discussed returning to the ED immediately if new or worsening sx occur. We discussed the sx which are most concerning (e.g., uncontrolled bleeding) that necessitate immediate return. Medications administered to the patient during their visit and any new prescriptions provided to the patient are listed below.  Medications given during this visit Medications - No data to display  New Prescriptions   No medications on file    The patient appears reasonably screen and/or stabilized for discharge and I doubt any other medical condition or other [facility] requiring further screening, evaluation, or treatment in the ED at this time prior to discharge.     I personally performed the services described in this documentation, which was scribed in my presence. The recorded information has been reviewed and is accurate.    Melene Plan, DO 10/07/15 2133

## 2015-10-07 NOTE — ED Notes (Addendum)
Pt alert, NAD, calm, interactive, resps e/u, speaking in clear complete sentences, c/o pimple-like bump in the R mastoid area in the hairline, reports 3 episodes of "lots of bleeding", resolved at this time, no signs of bleeding, 0.245mm skin colored bump noted (denies: fever, nv, dizziness, pain or swelling).

## 2015-10-07 NOTE — Discharge Instructions (Signed)
This may be a keloid, mole or AVM.  Follow up with dermatology for possible evaluation and or removal.

## 2015-11-30 ENCOUNTER — Emergency Department (HOSPITAL_COMMUNITY): Payer: Self-pay

## 2015-11-30 ENCOUNTER — Encounter (HOSPITAL_COMMUNITY): Payer: Self-pay | Admitting: Emergency Medicine

## 2015-11-30 ENCOUNTER — Emergency Department (HOSPITAL_COMMUNITY)
Admission: EM | Admit: 2015-11-30 | Discharge: 2015-11-30 | Disposition: A | Discharge status: Home / Self Care | Payer: Self-pay | Attending: Emergency Medicine | Admitting: Emergency Medicine

## 2015-11-30 DIAGNOSIS — R0789 Other chest pain: Secondary | ICD-10-CM | POA: Insufficient documentation

## 2015-11-30 DIAGNOSIS — R350 Frequency of micturition: Secondary | ICD-10-CM | POA: Insufficient documentation

## 2015-11-30 DIAGNOSIS — R079 Chest pain, unspecified: Secondary | ICD-10-CM

## 2015-11-30 DIAGNOSIS — R0602 Shortness of breath: Secondary | ICD-10-CM | POA: Insufficient documentation

## 2015-11-30 DIAGNOSIS — Z791 Long term (current) use of non-steroidal anti-inflammatories (NSAID): Secondary | ICD-10-CM | POA: Insufficient documentation

## 2015-11-30 DIAGNOSIS — R61 Generalized hyperhidrosis: Secondary | ICD-10-CM | POA: Insufficient documentation

## 2015-11-30 DIAGNOSIS — F17200 Nicotine dependence, unspecified, uncomplicated: Secondary | ICD-10-CM | POA: Insufficient documentation

## 2015-11-30 LAB — CBC
HEMATOCRIT: 44.8 % (ref 39.0–52.0)
HEMOGLOBIN: 14.5 g/dL (ref 13.0–17.0)
MCH: 25.8 pg — ABNORMAL LOW (ref 26.0–34.0)
MCHC: 32.4 g/dL (ref 30.0–36.0)
MCV: 79.9 fL (ref 78.0–100.0)
Platelets: 220 10*3/uL (ref 150–400)
RBC: 5.61 MIL/uL (ref 4.22–5.81)
RDW: 13.5 % (ref 11.5–15.5)
WBC: 4.9 10*3/uL (ref 4.0–10.5)

## 2015-11-30 LAB — BASIC METABOLIC PANEL
ANION GAP: 8 (ref 5–15)
BUN: 16 mg/dL (ref 6–20)
CO2: 25 mmol/L (ref 22–32)
Calcium: 9.4 mg/dL (ref 8.9–10.3)
Chloride: 105 mmol/L (ref 101–111)
Creatinine, Ser: 1.23 mg/dL (ref 0.61–1.24)
GFR calc Af Amer: >60 mL/min (ref >60)
GLUCOSE: 88 mg/dL (ref 65–99)
POTASSIUM: 3.8 mmol/L (ref 3.5–5.1)
SODIUM: 138 mmol/L (ref 135–145)

## 2015-11-30 LAB — I-STAT TROPONIN, ED: Troponin i, poc: 0 ng/mL (ref 0.00–0.08)

## 2015-11-30 MED ORDER — IOPAMIDOL (ISOVUE-370) INJECTION 76%
100.0000 mL | Freq: Once | INTRAVENOUS | Status: AC | PRN
  Administered 2015-11-30: 100 mL via INTRAVENOUS

## 2015-11-30 NOTE — ED Notes (Signed)
Pt reports intermittent central chest pain worse with movement for a week described as discomfort and sharp.

## 2015-11-30 NOTE — ED Notes (Signed)
Bed: WA03 Expected date:  Expected time:  Means of arrival:  Comments: [place]

## 2015-11-30 NOTE — Discharge Instructions (Signed)
Read the information below.   Your labwork and imaging was re-assuring. If you experience pain I encourage you to take tylenol 650mg  every 6hrs or 400mg  motrin every 6hrs as needed for pain relief.  I encourage you to establish a primary care provider. I have provided the contact information for [facility]. I have also provided other resources below for local providers.  You may return to the Emergency Department at any time for worsening condition or any new symptoms that concern you. Return to ED immediately if your symptoms worsen or you develop new symptoms such as crushing chest pain, shortness of breath, unilateral leg swelling/pain, dizziness/lightheadedness.   AllstateCommunity Resource Guide Financial Assistance The United Ways 211 is a great source of information about community services available.  Access by dialing 2-1-1 from anywhere in [place], or by website -  PooledIncome.plwww.nc211.org.   Other Local Resources (Updated 05/2015)  Financial Assistance   Services    Phone Number and Address  [facility]  Low-cost medical care - 1st and 3rd Saturday of every month  Must not qualify for public or private insurance and must have limited income [phone] [address] [place], [place]    [place] Department of Social Services  Child care  Emergency assistance for housing and Kimberly-Clarkutilities  Food stamps  Medicaid [phone] [address] [place], [place] [address]   [facility]  Low-cost medical care for children, communicable diseases, sexually-transmitted diseases, immunizations, maternity care, womens health and family planning [phone] [place], [place] [address]  [facility]   Medication assistance for [facility] [place] residents  Must meet income requirements [phone] [address] [place], [place].    [facility] Social Services  Child care  Emergency assistance for housing and Kimberly-Clarkutilities  Food stamps  Medicaid [phone] [address] [place], [place] [address]  [facility]   Low-cost medical care,   Monday through Friday, 9 am to 6 pm.   Accepts Medicare/Medicaid, and self-pay [phone] [address] [place], [place] [address]  [facility]  Low-cost medical care - Monday through Friday, 8:30 am - 5:30 pm  Accepts Medicaid and self-pay [phone] [address], [address] [place], [place] [address]   [facility]  Primary medical care, including for those with sickle cell disease  Accepts Medicare, Medicaid, insurance and self-pay [phone] [address] [place], [place]  [facility]   Primary medical care  Accepts Medicare, [place], insurance and self-pay [phone], [address] [place], [place] [address]   [facility] Department of Social Services  Child care  Emergency assistance for housing and Kimberly-Clarkutilities  Food stamps  Medicaid [phone] [address] [place], [place] [address]  [facility] Department of Health and [employer]MaxHuman Services  Child care  Emergency assistance for housing and Kimberly-Clarkutilities  Food stamps  Medicaid [phone] [address] [place], [place] [address]   [facility]  Medication assistance for [facility] residents with no insurance only  Must have a primary care doctor [phone] [address], [address] [place], [place]  [facility]   Primary medical care  [place] Medicare, [place]icaid, insurance  [phone] [address] [place], [place]  MedAssist   Medication assistance [phone]  [facility]   Primary medical care  Accepts Medicare, Medicaid, insurance and  self-pay [phone] [address]  [place], [place] [address]  [facility]   Primary medical care  Accepts Medicare, [place]icaid, insurance and self-pay [phone] [address] [place], [place] [address]  [facility]  For [place] residents between the ages of 5918 and 5464 who do not have any form of health insurance, Medicare, [place]icaid, or [place] benefits.  Services are provided free of charge to uninsured patients who fall within federal poverty guidelines.    Hours: Tuesdays and Thursdays, 4:15 - 8 pm [phone] [address], [address] [place], [place] [address]  [facility]     Primary medical care  Dental care  Nutritional counseling  Pharmacy  Accepts Medicaid, Medicare, most insurance.  Fees are adjusted based on ability to pay.   [phone] [facility] [address] [place], [place]  [phone] [facility] [address] [place], [place]  [phone] [facility] [place], [place]  [phone] [facility], [address] [place], [place]  [phone] [facility] [address] [place], [place]  [facility]  Womens health and family planning [phone] [address] [place], [place]  [facility] Department of Social Services  Child care  Emergency assistance for housing and Kimberly-Clarkutilities  Food stamps  Medicaid [phone] [address], [place], [place] [address]   [facility]    Ages 5718 and older  Hours: Mondays and Thursdays, 7:00 am - 9:00 am Patients are seen on a first come, first served basis. [phone] [address] [place], [place]  [facility] Division of Social Services  Child care  Emergency assistance for housing and Kimberly-Clarkutilities  Food stamps  Medicaid [phone] [address] [place], [place] [address]  [facility] [employer]  Medication assistance  Rental  assistance  Food pantry  Medication assistance  Housing assistance  Emergency food distribution  Utility assistance [phone] [address] [place], [place]  [phone]  [address] [place], [place] [address] Hours: Tuesdays and Thursdays from 9am - 12 noon by appointment only  [phone] [address] [place], [place] [address]  [facility] Lanae Boastlara F. Gunn   Accepts private insurance, [place]icare, and [place]icaid.  Payment is based on a sliding scale for those without insurance.  Hours: Mondays, Tuesdays and Thursdays, 8:30 am - 5:30 pm.   [phone] [address] [place], [place]  [facility] - Family Medicine at [facility]    Accepts private insurance, [place]icare, and [place]icaid.  Payment is based on a sliding scale for those without insurance. [phone] [address] [place], [place]  [facility] - Pediatrics at E. Scientist, research (physical sciences)[place]  Accepts private insurance, Harrah's EntertainmentMedicare, and [place]icaid.  Payment is based on a sliding scale for those without insurance [phone] [address], [place], [place]  [facility] - [facility]s at [place]  Accepts private insurance, [place] Port[place]icare, and [place]icaid.  Payment is based on a sliding scale for those without insurance. [phone] [address] [place], [place]  [facility] - [facility]s at [facility]  Accepts private insurance, [place]icare, and [place]icaid.  Payment is based on a sliding scale for those without insurance. [phone], [phone] [place], [place].    [facility]  Maternity care.  Accepts Medicaid and self-pay. [phone] [address] [place], [place]   Nonspecific Chest Pain  It is often hard to find the cause of chest pain. There is always a chance that your pain could be related to something serious, such as a heart attack or a blood  clot in your lungs. Chest pain can also be caused by conditions that are not life-threatening. If you have chest pain, it is very important to follow up with your doctor.  HOME CARE  If you were prescribed an antibiotic medicine, finish it all even if you start to feel better.  Avoid any activities that cause chest pain.  Do not use any tobacco products, including cigarettes, chewing tobacco, or electronic cigarettes. If you need help quitting, ask your doctor.  Do not drink alcohol.  Take medicines only as told by your doctor.  Keep all follow-up visits as told by your doctor. This is important. This includes any further testing if your chest pain does not go away.  Your doctor may tell you to keep your head raised (elevated) while you sleep.  Make lifestyle changes as told by your doctor. These may include:  Getting regular exercise. Ask your doctor to suggest some activities that are safe for you.  Eating a heart-healthy diet. Your doctor or a diet specialist (dietitian) can help you to learn healthy eating options.  Maintaining a healthy weight.  Managing diabetes, if necessary.  Reducing stress. GET HELP IF:  Your chest pain does not go away, even after treatment.  You have a rash with blisters on your chest.  You have a fever. GET HELP RIGHT AWAY IF:  Your chest pain is worse.  You have an increasing cough, or you cough up blood.  You have severe belly (abdominal) pain.  You feel extremely weak.  You pass out (faint).  You have chills.  You have sudden, unexplained chest discomfort.  You have sudden, unexplained discomfort in your arms, back, neck, or jaw.  You have shortness of breath at any time.  You suddenly start to sweat, or your skin gets clammy.  You feel nauseous.  You vomit.  You suddenly feel light-headed or dizzy.  Your heart begins to beat quickly, or it feels like it is skipping beats. These symptoms may be an emergency. Do not wait  to see if the symptoms will go away. Get medical help right away. Call your local emergency services (911 in the U.S.). Do not drive yourself to the hospital.   This information is not intended to replace advice given to you by your health care provider. Make sure you discuss any questions you have with your health care provider.   Document Released: 10/29/2007 Document Revised: 06/02/2014 Document Reviewed: 12/16/2013 Elsevier Interactive Patient Education [employer]

## 2015-11-30 NOTE — ED Provider Notes (Signed)
CSN: [id number]     Arrival date & time 11/30/15  1331 History   First MD Initiated Contact with Patient 11/30/15 1546     Chief Complaint  Patient presents with  . Chest Pain     (Consider location/radiation/quality/duration/timing/severity/associated sxs/prior Treatment) HPI Comments: Ross Ryan is a 35 y.o. male with h/o acid reflux presents to ED with complaint of chest pain for the past few days. Pt states pain is intermittent, sharp, and centrally location. Denies radiation. No discernable pattern. Duration varies. Worse with deep inspiration on occasion and with movement. He has associated dyspnea on exertion on occasion and states he had an episode of hemoptysis today.  Denies trauma to chest wall or change in activity. No lower leg swelling/pain. No h/o cancer. No h/o long distance travel/surgery/immobilization. No h/o blood clots. No abdominal complaints currently. Endorses some increase in frequency, denies dysuria or hematuria. No fever, chills, headache, changes in vision, sore throat, weakness. Endorses marijuana use, denies other drug use. No medical conditions. No family h/o of cardiac disease.   Patient is a 35 y.o. male presenting with chest pain. The history is provided by the patient and medical records.  Chest Pain Associated symptoms: diaphoresis ( nighttime) and shortness of breath ( intermittent DOE)     History reviewed. No pertinent past medical history. Past Surgical History  Procedure Laterality Date  . Knee surgery     No family history on file. Social History  Substance Use Topics  . Smoking status: Current Some Day Smoker  . Smokeless tobacco: None  . Alcohol Use: Yes    Review of Systems  Constitutional: Positive for diaphoresis ( nighttime).  Respiratory: Positive for shortness of breath ( intermittent DOE).   Cardiovascular: Positive for chest pain ( intermittent).  Genitourinary: Positive for frequency.  Psychiatric/Behavioral: The patient is  nervous/anxious.   All other systems reviewed and are negative.     Allergies  Review of patient's allergies indicates no known allergies.  Home Medications   Prior to Admission medications   Medication Sig Start Date End Date Taking? Authorizing Provider  ibuprofen (ADVIL,MOTRIN) 200 MG tablet Take 200-400 mg by mouth every 6 (six) hours as needed for headache or moderate pain.   Yes Historical Provider, MD   BP 122/87 mmHg  Pulse 70  Temp(Src) 98.4 F (36.9 C) (Oral)  Resp 18  SpO2 100% Physical Exam  Constitutional: He appears well-developed and well-nourished. No distress.  HENT:  Head: Normocephalic and atraumatic.  Mouth/Throat: Oropharynx is clear and moist. No oropharyngeal exudate.  Eyes: Conjunctivae and EOM are normal. Pupils are equal, round, and reactive to light. Right eye exhibits no discharge. Left eye exhibits no discharge. No scleral icterus.  Neck: Normal range of motion. Neck supple.  Cardiovascular: Normal rate, regular rhythm, normal heart sounds and intact distal pulses.   No murmur heard. Pulmonary/Chest: Effort normal and breath sounds normal. No respiratory distress. He exhibits no tenderness and no bony tenderness.  Abdominal: Soft. Bowel sounds are normal. There is no tenderness. There is no rebound and no guarding.  Musculoskeletal: Normal range of motion. He exhibits no edema or tenderness.  Lymphadenopathy:    He has no cervical adenopathy.  Neurological: He is alert. Coordination normal.  Skin: Skin is warm and dry. He is not diaphoretic.  Psychiatric: His mood appears anxious.    ED Course  Procedures (including critical care time) Labs Review Labs Reviewed  CBC - Abnormal; Notable for the following:    MCH 25.8 (*)  All other components within normal limits  BASIC METABOLIC PANEL  I-STAT TROPOININ, ED    Imaging Review Dg Chest 2 View  11/30/2015  CLINICAL DATA:  Chest pain for 2 days, initial encounter EXAM: CHEST  2 VIEW  COMPARISON:  None. FINDINGS: The heart size and mediastinal contours are within normal limits. Both lungs are clear. The visualized skeletal structures are unremarkable. IMPRESSION: No active cardiopulmonary disease. Electronically Signed   By: Alcide CleverMark  Lukens M.D.   On: 11/30/2015 14:28   Ct Angio Chest Pe W/cm &/or Wo Cm  11/30/2015  CLINICAL DATA:  Chest pain and shortness of breath EXAM: CT ANGIOGRAPHY CHEST WITH CONTRAST TECHNIQUE: Multidetector CT imaging of the chest was performed using the standard protocol during bolus administration of intravenous contrast. Multiplanar CT image reconstructions and MIPs were obtained to evaluate the vascular anatomy. CONTRAST:  100 mL Isovue 370 nonionic COMPARISON:  Chest radiograph November 30, 2015 FINDINGS: Cardiovascular: There is no demonstrable pulmonary embolus. There is no appreciable thoracic aortic aneurysm or dissection. The visualized great vessels appear unremarkable. The pericardium is not appreciably thickened. Mediastinum/Nodes: Visualized thyroid appears normal. There is no appreciable thoracic adenopathy. Lungs/Pleura: Lungs are clear. No abnormal pleural thickening or pleural effusion. Upper Abdomen: Visualized upper abdominal structures appear unremarkable. Musculoskeletal: There are no blastic or lytic bone lesions. Review of the MIP images confirms the above findings. IMPRESSION: No demonstrable pulmonary embolus. Lungs clear. No apparent adenopathy. Electronically Signed   By: Bretta BangWilliam  Woodruff III M.D.   On: 11/30/2015 19:22   I have personally reviewed and evaluated these images and lab results as part of my medical decision-making.   EKG Interpretation   Date/Time:  Friday November 30 2015 13:40:42 EDT Ventricular Rate:  65 PR Interval:    QRS Duration: 94 QT Interval:  383 QTC Calculation: 399 R Axis:   13 Text Interpretation:  Sinus rhythm T wave inversion No significant change  since last tracing Confirmed by Bebe ShaggyWICKLINE  MD, DONALD ([id number])  on 11/30/2015  1:59:40 PM      MDM   Final diagnoses:  Chest pain, unspecified chest pain type   Patient is afebrile and non-toxic. He appears anxious. Vital signs are stable. Physical exam re-assuring. EKG shows no acute changes. Troponin negative. Heart score 2. CXR negative for PNA, plueral effusion, or PTX. No air under diaphragm, doubt esophageal rupture. Distal pulses intact, doubt aortic dissection. CTA negative for PE. Unsure etiology of chest pain  - ?MSK vs. ?Anxiety.   Discussed results with patient. Symptomatic management to include tylenol or motrin. Encouraged follow up with PCP, provided resources. Discussed return precautions. Pt voiced understanding and is agreeable.     Lona Kettleshley Laurel Dearion Huot, PA-C 12/01/15 1059  Lorre NickAnthony Allen, MD 12/02/15 0000

## 2016-06-16 DIAGNOSIS — R059 Cough, unspecified: Secondary | ICD-10-CM | POA: Insufficient documentation

## 2016-06-16 DIAGNOSIS — R05 Cough: Secondary | ICD-10-CM | POA: Insufficient documentation

## 2016-12-24 DIAGNOSIS — I10 Essential (primary) hypertension: Secondary | ICD-10-CM

## 2016-12-24 HISTORY — DX: Essential (primary) hypertension: I10

## 2016-12-31 DIAGNOSIS — D564 Hereditary persistence of fetal hemoglobin [HPFH]: Secondary | ICD-10-CM | POA: Insufficient documentation

## 2017-02-02 DIAGNOSIS — R1084 Generalized abdominal pain: Secondary | ICD-10-CM | POA: Insufficient documentation

## 2017-02-02 DIAGNOSIS — E538 Deficiency of other specified B group vitamins: Secondary | ICD-10-CM | POA: Insufficient documentation

## 2017-04-02 DIAGNOSIS — L280 Lichen simplex chronicus: Secondary | ICD-10-CM | POA: Insufficient documentation

## 2017-06-17 ENCOUNTER — Encounter (HOSPITAL_BASED_OUTPATIENT_CLINIC_OR_DEPARTMENT_OTHER): Payer: Self-pay | Admitting: *Deleted

## 2017-06-17 ENCOUNTER — Other Ambulatory Visit: Payer: Self-pay

## 2017-06-17 ENCOUNTER — Emergency Department (HOSPITAL_BASED_OUTPATIENT_CLINIC_OR_DEPARTMENT_OTHER)
Admission: EM | Admit: 2017-06-17 | Discharge: 2017-06-17 | Disposition: A | Discharge status: Home / Self Care | Payer: BLUE CROSS/BLUE SHIELD | Attending: Emergency Medicine | Admitting: Emergency Medicine

## 2017-06-17 DIAGNOSIS — Z79899 Other long term (current) drug therapy: Secondary | ICD-10-CM | POA: Insufficient documentation

## 2017-06-17 DIAGNOSIS — I10 Essential (primary) hypertension: Secondary | ICD-10-CM | POA: Diagnosis present

## 2017-06-17 HISTORY — DX: Essential (primary) hypertension: I10

## 2017-06-17 LAB — CBC WITH DIFFERENTIAL/PLATELET
BASOS ABS: 0 10*3/uL (ref 0.0–0.1)
Basophils Relative: 0 %
Eosinophils Absolute: 0 10*3/uL (ref 0.0–0.7)
Eosinophils Relative: 1 %
HCT: 38.7 % — ABNORMAL LOW (ref 39.0–52.0)
Hemoglobin: 12.7 g/dL — ABNORMAL LOW (ref 13.0–17.0)
LYMPHS PCT: 41 %
Lymphs Abs: 2.3 10*3/uL (ref 0.7–4.0)
MCH: 25.9 pg — AB (ref 26.0–34.0)
MCHC: 32.8 g/dL (ref 30.0–36.0)
MCV: 78.8 fL (ref 78.0–100.0)
MONO ABS: 0.8 10*3/uL (ref 0.1–1.0)
MONOS PCT: 14 %
Neutro Abs: 2.5 10*3/uL (ref 1.7–7.7)
Neutrophils Relative %: 44 %
Platelets: 194 10*3/uL (ref 150–400)
RBC: 4.91 MIL/uL (ref 4.22–5.81)
RDW: 13.5 % (ref 11.5–15.5)
WBC: 5.5 10*3/uL (ref 4.0–10.5)

## 2017-06-17 LAB — BASIC METABOLIC PANEL
ANION GAP: 8 (ref 5–15)
BUN: 14 mg/dL (ref 6–20)
CALCIUM: 9 mg/dL (ref 8.9–10.3)
CO2: 25 mmol/L (ref 22–32)
CREATININE: 1.24 mg/dL (ref 0.61–1.24)
Chloride: 104 mmol/L (ref 101–111)
GFR calc Af Amer: >60 mL/min (ref >60)
GLUCOSE: 97 mg/dL (ref 65–99)
Potassium: 3.5 mmol/L (ref 3.5–5.1)
Sodium: 137 mmol/L (ref 135–145)

## 2017-06-17 LAB — TROPONIN I: Troponin I: <0.03 ng/mL (ref <0.03)

## 2017-06-17 MED ORDER — AMLODIPINE BESYLATE 10 MG PO TABS: 10.0000 mg | ORAL_TABLET | Freq: Every day | ORAL | 0 refills | Status: DC

## 2017-06-17 NOTE — ED Notes (Signed)
Pt verbalizes understanding of d/c instructions and denies any further needs at this time. 

## 2017-06-17 NOTE — ED Triage Notes (Signed)
[facility] was [facility] and left after waiting for an hour and a half.  Came here with a c/o of left shoulder pain that radiates to his left upper arm and tingling sensation to his left upper back.

## 2017-06-17 NOTE — ED Provider Notes (Signed)
[facility] Provider Note: Lowella Dell, MD, FACEP  CSN: [id number] MRN: [id number] ARRIVAL: 06/17/17 at 0013 ROOM: [facility]11/[facility]11   CHIEF COMPLAINT  Hypertension   HISTORY OF PRESENT ILLNESS  06/17/17 12:52 AM Ross Ryan is a 37 y.o. male with a history of hypertension on amlodipine 5 mg daily.  He states he has been compliant with this.  He is here stating his blood pressures have been higher than usual for about the last week.  Prior to arrival his blood pressure was 166/128.  It was noted to be 161/110 on arrival.  He states he has not felt "right" for the past week.  He has had some poorly characterized malaise, night sweats and intermittent shortness of breath.  He is also had some intermittent tingling pains in his left shoulder as well as numbness in both arms; these are worse with certain movements of his neck.  The pain radiates to his left shoulder and left upper back.  He is denying pain or shortness of breath at the present time.  He rates his pain as a 7 out of 10 at its worst.   Past Medical History:  Diagnosis Date  . Hypertension 12/2016    Past Surgical History:  Procedure Laterality Date  . Fatty tumor removed from right Posterior forearm Right 2010  . KNEE SURGERY      History reviewed. No pertinent family history.  Social History   Tobacco Use  . Smoking status: Never Smoker  Substance Use Topics  . Alcohol use: Yes    Comment: seldom  . Drug use: Yes    Types: Marijuana    Comment: last used 04/2017    Prior to Admission medications   Medication Sig Start Date End Date Taking? Authorizing Provider  amLODipine (NORVASC) 10 MG tablet Take 10 mg by mouth daily.   Yes [provider]  clobetasol cream (TEMOVATE) 0.05 % Apply 1 application topically 2 (two) times daily.   Yes [provider]  vitamin B-12 (CYANOCOBALAMIN) 250 MCG tablet Take 250 mcg by mouth daily.   Yes [provider]  folic acid (FOLVITE) 1  MG tablet Take 1 mg by mouth daily.    [provider]  ibuprofen (ADVIL,MOTRIN) 200 MG tablet Take 200-400 mg by mouth every 6 (six) hours as needed for headache or moderate pain.    [provider]  omeprazole (PRILOSEC) 20 MG capsule Take 20 mg by mouth daily.    [provider]    Allergies Patient has no known allergies.   REVIEW OF SYSTEMS  Negative except as noted here or in the History of Present Illness.   PHYSICAL EXAMINATION  Initial Vital Signs Blood pressure (!) 161/110, pulse 80, temperature (!) 97.5 F (36.4 C), temperature source Oral, resp. rate 18, height 6\' 4"  (1.93 m), weight 94.8 kg (209 lb), SpO2 100 %.  Examination General: Well-developed, well-nourished male in no acute distress; appearance consistent with age of record HENT: normocephalic; atraumatic Eyes: pupils equal, round and reactive to light; extraocular muscles intact Neck: supple Heart: regular rate and rhythm Lungs: clear to auscultation bilaterally Abdomen: soft; nondistended; nontender; bowel sounds present Extremities: No deformity; full range of motion; pulses normal Neurologic: Awake, alert and oriented; motor function intact in all extremities and symmetric; no facial droop Skin: Warm and dry Psychiatric: Anxious   RESULTS  Summary of this visit's results, reviewed by myself:   EKG Interpretation  Date/Time:  Wednesday June 17 2017 00:26:16 EST  Ventricular Rate:  64 PR Interval:    QRS Duration: 106 QT Interval:  407 QTC Calculation: 420 R Axis:   10 Text Interpretation:  Sinus rhythm RSR' in V1 or V2, probably normal variant No significant change was found Confirmed by Paula LibraMolpus, Maycel Riffe ([id number]) on 06/17/2017 12:29:25 AM      Laboratory Studies: Results for orders placed or performed during the hospital encounter of 06/17/17 (from the past 24 hour(s))  CBC with Differential/Platelet     Status: Abnormal   Collection Time: 06/17/17  1:20 AM  Result  Value Ref Range   WBC 5.5 4.0 - 10.5 K/uL   RBC 4.91 4.22 - 5.81 MIL/uL   Hemoglobin 12.7 (L) 13.0 - 17.0 g/dL   HCT 86.538.7 (L) 78.439.0 - 69.652.0 %   MCV 78.8 78.0 - 100.0 fL   MCH 25.9 (L) 26.0 - 34.0 pg   MCHC 32.8 30.0 - 36.0 g/dL   RDW 29.513.5 28.411.5 - 13.215.5 %   Platelets 194 150 - 400 K/uL   Neutrophils Relative % 44 %   Neutro Abs 2.5 1.7 - 7.7 K/uL   Lymphocytes Relative 41 %   Lymphs Abs 2.3 0.7 - 4.0 K/uL   Monocytes Relative 14 %   Monocytes Absolute 0.8 0.1 - 1.0 K/uL   Eosinophils Relative 1 %   Eosinophils Absolute 0.0 0.0 - 0.7 K/uL   Basophils Relative 0 %   Basophils Absolute 0.0 0.0 - 0.1 K/uL  Basic metabolic panel     Status: None   Collection Time: 06/17/17  1:20 AM  Result Value Ref Range   Sodium 137 135 - 145 mmol/L   Potassium 3.5 3.5 - 5.1 mmol/L   Chloride 104 101 - 111 mmol/L   CO2 25 22 - 32 mmol/L   Glucose, Bld 97 65 - 99 mg/dL   BUN 14 6 - 20 mg/dL   Creatinine, Ser 4.401.24 0.61 - 1.24 mg/dL   Calcium 9.0 8.9 - 10.210.3 mg/dL   GFR calc non Af Amer >60 >60 mL/min   GFR calc Af Amer >60 >60 mL/min   Anion gap 8 5 - 15  Troponin I     Status: None   Collection Time: 06/17/17  1:20 AM  Result Value Ref Range   Troponin I <0.03 <0.03 ng/mL   Imaging Studies: No results found.  ED COURSE  Nursing notes and initial vitals signs, including pulse oximetry, reviewed.  Vitals:   06/17/17 0029 06/17/17 0032 06/17/17 0130  BP: (!) 161/110  (!) 148/86  Pulse: 80  71  Resp: 18  19  Temp: (!) 97.5 F (36.4 C)    TempSrc: Oral    SpO2: 100% 100% 100%  Weight: 94.8 kg (209 lb)    Height: 6\' 4"  (1.93 m)     1:59 AM Patient advised of reassuring lab work and EKG.  As his blood pressures been running higher than usual for the past week we will increase his dose of Norvasc and refer him back to his PCP, Dr. Jordan LikesSpivey.  The pain in his shoulder and associated paresthesias are more consistent with cervical radiculopathy than cardiac etiology.  PROCEDURES    ED DIAGNOSES      ICD-10-CM   1. Hypertension not at goal I10        Janyce Ellinger, Jonny RuizJohn, MD 06/17/17 [id number]

## 2017-09-11 ENCOUNTER — Emergency Department (HOSPITAL_BASED_OUTPATIENT_CLINIC_OR_DEPARTMENT_OTHER)
Admission: EM | Admit: 2017-09-11 | Discharge: 2017-09-11 | Disposition: A | Discharge status: Home / Self Care | Payer: BLUE CROSS/BLUE SHIELD | Attending: Physician Assistant | Admitting: Physician Assistant

## 2017-09-11 ENCOUNTER — Encounter (HOSPITAL_BASED_OUTPATIENT_CLINIC_OR_DEPARTMENT_OTHER): Payer: Self-pay | Admitting: *Deleted

## 2017-09-11 ENCOUNTER — Other Ambulatory Visit: Payer: Self-pay

## 2017-09-11 DIAGNOSIS — R51 Headache: Secondary | ICD-10-CM | POA: Insufficient documentation

## 2017-09-11 DIAGNOSIS — Y939 Activity, unspecified: Secondary | ICD-10-CM | POA: Diagnosis not present

## 2017-09-11 DIAGNOSIS — Y929 Unspecified place or not applicable: Secondary | ICD-10-CM | POA: Diagnosis not present

## 2017-09-11 DIAGNOSIS — Y999 Unspecified external cause status: Secondary | ICD-10-CM | POA: Insufficient documentation

## 2017-09-11 DIAGNOSIS — I10 Essential (primary) hypertension: Secondary | ICD-10-CM | POA: Insufficient documentation

## 2017-09-11 DIAGNOSIS — Z79899 Other long term (current) drug therapy: Secondary | ICD-10-CM | POA: Insufficient documentation

## 2017-09-11 MED ORDER — METHOCARBAMOL 500 MG PO TABS: 500.0000 mg | ORAL_TABLET | Freq: Two times a day (BID) | ORAL | 0 refills | Status: DC

## 2017-09-11 MED ORDER — ACETAMINOPHEN 500 MG PO TABS: 500.0000 mg | ORAL_TABLET | Freq: Four times a day (QID) | ORAL | 0 refills | Status: DC | PRN

## 2017-09-11 MED ORDER — IBUPROFEN 600 MG PO TABS: 600.0000 mg | ORAL_TABLET | Freq: Four times a day (QID) | ORAL | 0 refills | Status: DC | PRN

## 2017-09-11 MED FILL — IBUPROFEN 600 MG TABLET: 600 | 8 days supply | Qty: 30 | Fill #0

## 2017-09-11 MED FILL — METHOCARBAMOL 500 MG TABLET: 500 | 10 days supply | Qty: 20 | Fill #0
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
## 2017-09-11 NOTE — ED Notes (Signed)
ED Provider at bedside. 
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
## 2017-10-21 DIAGNOSIS — R634 Abnormal weight loss: Secondary | ICD-10-CM | POA: Insufficient documentation

## 2018-04-29 ENCOUNTER — Emergency Department (HOSPITAL_BASED_OUTPATIENT_CLINIC_OR_DEPARTMENT_OTHER)
Admission: EM | Admit: 2018-04-29 | Discharge: 2018-04-29 | Disposition: A | Discharge status: Home / Self Care | Payer: BLUE CROSS/BLUE SHIELD | Attending: Emergency Medicine | Admitting: Emergency Medicine

## 2018-04-29 ENCOUNTER — Other Ambulatory Visit: Payer: Self-pay

## 2018-04-29 ENCOUNTER — Emergency Department (HOSPITAL_BASED_OUTPATIENT_CLINIC_OR_DEPARTMENT_OTHER): Payer: BLUE CROSS/BLUE SHIELD

## 2018-04-29 ENCOUNTER — Encounter (HOSPITAL_BASED_OUTPATIENT_CLINIC_OR_DEPARTMENT_OTHER): Payer: Self-pay

## 2018-04-29 DIAGNOSIS — M79602 Pain in left arm: Secondary | ICD-10-CM

## 2018-04-29 DIAGNOSIS — I10 Essential (primary) hypertension: Secondary | ICD-10-CM | POA: Insufficient documentation

## 2018-04-29 DIAGNOSIS — F419 Anxiety disorder, unspecified: Secondary | ICD-10-CM

## 2018-04-29 DIAGNOSIS — E876 Hypokalemia: Secondary | ICD-10-CM

## 2018-04-29 DIAGNOSIS — M542 Cervicalgia: Secondary | ICD-10-CM | POA: Insufficient documentation

## 2018-04-29 DIAGNOSIS — Z79899 Other long term (current) drug therapy: Secondary | ICD-10-CM | POA: Insufficient documentation

## 2018-04-29 LAB — COMPREHENSIVE METABOLIC PANEL
ALK PHOS: 53 U/L (ref 38–126)
ALT: 45 U/L — ABNORMAL HIGH (ref 0–44)
ANION GAP: 6 (ref 5–15)
AST: 32 U/L (ref 15–41)
Albumin: 4 g/dL (ref 3.5–5.0)
BILIRUBIN TOTAL: 0.7 mg/dL (ref 0.3–1.2)
BUN: 11 mg/dL (ref 6–20)
CALCIUM: 9 mg/dL (ref 8.9–10.3)
CO2: 27 mmol/L (ref 22–32)
Chloride: 104 mmol/L (ref 98–111)
Creatinine, Ser: 1.09 mg/dL (ref 0.61–1.24)
GFR calc non Af Amer: >60 mL/min (ref >60)
Glucose, Bld: 94 mg/dL (ref 70–99)
Potassium: 3.4 mmol/L — ABNORMAL LOW (ref 3.5–5.1)
Sodium: 137 mmol/L (ref 135–145)
TOTAL PROTEIN: 7.4 g/dL (ref 6.5–8.1)

## 2018-04-29 LAB — URINALYSIS, ROUTINE W REFLEX MICROSCOPIC
BILIRUBIN URINE: NEGATIVE
Glucose, UA: NEGATIVE mg/dL
Hgb urine dipstick: NEGATIVE
Ketones, ur: NEGATIVE mg/dL
Leukocytes, UA: NEGATIVE
NITRITE: NEGATIVE
PH: 6 (ref 5.0–8.0)
Protein, ur: NEGATIVE mg/dL
Specific Gravity, Urine: <1.005 — ABNORMAL LOW (ref 1.005–1.030)

## 2018-04-29 LAB — CBC
HCT: 40.1 % (ref 39.0–52.0)
HEMOGLOBIN: 12.5 g/dL — AB (ref 13.0–17.0)
MCH: 25.6 pg — ABNORMAL LOW (ref 26.0–34.0)
MCHC: 31.2 g/dL (ref 30.0–36.0)
MCV: 82.2 fL (ref 80.0–100.0)
Platelets: 200 10*3/uL (ref 150–400)
RBC: 4.88 MIL/uL (ref 4.22–5.81)
RDW: 14 % (ref 11.5–15.5)
WBC: 4.5 10*3/uL (ref 4.0–10.5)
nRBC: 0 % (ref 0.0–0.2)

## 2018-04-29 LAB — LIPASE, BLOOD: LIPASE: 28 U/L (ref 11–51)

## 2018-04-29 LAB — CK: Total CK: 285 U/L (ref 49–397)

## 2018-04-29 MED ORDER — IBUPROFEN 800 MG PO TABS
800.0000 mg | ORAL_TABLET | Freq: Once | ORAL | Status: AC
Start: 2018-04-29 — End: 2018-04-29
  Administered 2018-04-29: 800 mg via ORAL
  Filled 2018-04-29: qty 1

## 2018-04-29 MED ORDER — POTASSIUM CHLORIDE CRYS ER 20 MEQ PO TBCR
20.0000 meq | EXTENDED_RELEASE_TABLET | Freq: Once | ORAL | Status: AC
  Administered 2018-04-29: 20 meq via ORAL
  Filled 2018-04-29: qty 1

## 2018-04-29 MED ORDER — KETOROLAC TROMETHAMINE 15 MG/ML IJ SOLN
15.0000 mg | Freq: Once | INTRAMUSCULAR | Status: DC
  Filled 2018-04-29: qty 1

## 2018-04-29 NOTE — Discharge Instructions (Addendum)
°  Please follow up with your primary care doctor to discuss stress and the effect it can have on how you feel. Please also have your potassium level rechecked.

## 2018-04-29 NOTE — ED Notes (Signed)
ED Provider at bedside. 

## 2018-04-29 NOTE — ED Provider Notes (Signed)
[facility] EMERGENCY DEPARTMENT Provider Note   CSN: [id number] Arrival date & time: 04/29/18  1103     History   Chief Complaint Chief Complaint  Patient presents with  . Arm Pain    HPI Ross Ryan is a 37 y.o. male presenting with bilateral arm pain L>R, intermittent DOE, possible chest pain, some neck pain for past 4 days. He is somewhat of a poor historian. He states he started feeling off on Monday with some vague upper abdominal pain, chest pain, intermittent SOB, neck pain and bilateral arm pain. The arm pain is what brought him in today. He states his left inner bicep has sharp shooting pains running down it. This is also happening in the right arm but not as pronounced. He also reports some numbness and possible weakness in his arms. He has some SOB with "taking a few steps" occasionally. He works as Theatre managermental health counselor. He last exercised about 2 weeks ago and tried crossfit for the first time. He had some muscle soreness after that but it went away after 2-3 days. He reports somewhat decreased appetite. He is drinking well. He has normal urine and stools. He denies tobacco use. Social alcohol use. He is a daily marijuana user. He has taken ibuprofen as needed for discomfort over the past week.   HPI  Past Medical History:  Diagnosis Date  . Hypertension 12/2016    There are no active problems to display for this patient.   Past Surgical History:  Procedure Laterality Date  . Fatty tumor removed from right Posterior forearm Right 2010  . KNEE SURGERY         Home Medications    Prior to Admission medications   Medication Sig Start Date End Date Taking? Authorizing Provider  acetaminophen (TYLENOL) 500 MG tablet Take 1 tablet (500 mg total) by mouth every 6 (six) hours as needed. 09/11/17   Law, Waylan BogaAlexandra M, PA-C  amLODipine (NORVASC) 10 MG tablet Take 1 tablet (10 mg total) by mouth daily. 06/17/17   Molpus, John, MD  folic acid (FOLVITE) 1 MG  tablet Take 1 mg by mouth daily.    [provider]  ibuprofen (ADVIL,MOTRIN) 600 MG tablet Take 1 tablet (600 mg total) by mouth every 6 (six) hours as needed. 09/11/17   Law, Waylan BogaAlexandra M, PA-C  omeprazole (PRILOSEC) 20 MG capsule Take 20 mg by mouth daily.    [provider]  vitamin B-12 (CYANOCOBALAMIN) 250 MCG tablet Take 250 mcg by mouth daily.    [provider]    Family History No family history on file.  Social History Social History   Tobacco Use  . Smoking status: Never Smoker  . Smokeless tobacco: Never Used  Substance Use Topics  . Alcohol use: Yes    Comment: occ  . Drug use: Yes    Types: Marijuana     Allergies   Patient has no known allergies.   Review of Systems Review of Systems  Constitutional: Positive for appetite change, chills and fever. Negative for activity change, diaphoresis, fatigue and unexpected weight change.  HENT: Positive for congestion. Negative for dental problem, drooling, ear discharge, ear pain, facial swelling, hearing loss, postnasal drip, rhinorrhea, sinus pressure, sinus pain and sore throat.   Eyes: Negative for pain and visual disturbance.  Respiratory: Positive for chest tightness and shortness of breath. Negative for cough and wheezing.   Cardiovascular: Positive for chest pain. Negative for palpitations and leg swelling.  Gastrointestinal: Positive  for abdominal pain and nausea. Negative for blood in stool, constipation, diarrhea and vomiting.  Genitourinary: Negative for decreased urine volume and dysuria.  Musculoskeletal: Positive for myalgias and neck pain. Negative for gait problem and neck stiffness.  Skin: Negative for rash.  Neurological: Positive for weakness and numbness. Negative for seizures, syncope, speech difficulty, light-headedness and headaches.     Physical Exam Updated Vital Signs BP 129/87   Pulse 63   Temp 99.1 F (37.3 C) (Oral)   Resp 13   Ht 6\' 4"  (1.93 m)   Wt 102.1  kg   SpO2 100%   BMI 27.39 kg/m   Physical Exam  Constitutional: He is oriented to person, place, and time. He appears well-developed and well-nourished. No distress.  HENT:  Head: Normocephalic and atraumatic.  Mouth/Throat: Oropharynx is clear and moist. No oropharyngeal exudate.  Eyes: Pupils are equal, round, and reactive to light. Conjunctivae and EOM are normal.  Neck: Normal range of motion. Neck supple. Spinous process tenderness and muscular tenderness present.  Cardiovascular: Normal rate, normal heart sounds and intact distal pulses. Exam reveals no gallop and no friction rub.  No murmur heard. Pulmonary/Chest: Effort normal and breath sounds normal. No respiratory distress. He exhibits tenderness (with palpation of anterior chest wall).  Abdominal: Soft. He exhibits no distension and no mass. There is tenderness (with palpation of epigastric region). There is no guarding.  Lymphadenopathy:    He has no cervical adenopathy.  Neurological: He is alert and oriented to person, place, and time.  Skin: Skin is warm and dry. No rash noted. He is not diaphoretic.  Psychiatric: His mood appears anxious.     ED Treatments / Results  Labs (all labs ordered are listed, but only abnormal results are displayed) Labs Reviewed  CBC - Abnormal; Notable for the following components:      Result Value   Hemoglobin 12.5 (*)    MCH 25.6 (*)    All other components within normal limits  COMPREHENSIVE METABOLIC PANEL - Abnormal; Notable for the following components:   Potassium 3.4 (*)    ALT 45 (*)    All other components within normal limits  URINALYSIS, ROUTINE W REFLEX MICROSCOPIC - Abnormal; Notable for the following components:   Specific Gravity, Urine <1.005 (*)    All other components within normal limits  CK  LIPASE, BLOOD    EKG EKG Interpretation  Date/Time:  Thursday April 29 2018 11:37:03 EST Ventricular Rate:  63 PR Interval:    QRS Duration: 107 QT  Interval:  396 QTC Calculation: 406 R Axis:   2 Text Interpretation:  Sinus rhythm RSR' in V1 or V2, probably normal variant No significant change since last tracing Confirmed by Jacalyn Lefevre [phone]) on 04/29/2018 11:55:52 AM   Radiology Dg Chest 2 View  Result Date: 04/29/2018 CLINICAL DATA:  Chest pain and left arm pain over the last week. EXAM: CHEST - 2 VIEW COMPARISON:  11/30/2015 FINDINGS: Heart size is normal. Mediastinal shadows are normal. The lungs are clear. No bronchial thickening. No infiltrate, mass, effusion or collapse. Pulmonary vascularity is normal. No bony abnormality. IMPRESSION: Normal chest Electronically Signed   By: Paulina Fusi M.D.   On: 04/29/2018 13:00    Procedures Procedures (including critical care time)  Medications Ordered in ED Medications  potassium chloride SA (K-DUR,KLOR-CON) CR tablet 20 mEq (has no administration in time range)  ibuprofen (ADVIL,MOTRIN) tablet 800 mg (800 mg Oral Given 04/29/18 1247)     Initial Impression /  Assessment and Plan / ED Course  I have reviewed the triage vital signs and the nursing notes.  Pertinent labs & imaging results that were available during my care of the patient were reviewed by me and considered in my medical decision making (see chart for details).     Well appearing 37 year old male with HTN presenting with left arm pain and various other complaints. Unclear if these things are related. He appears anxious and I feel this is contributing strongly to his various somatic complaints. Will check CXR given tenderness and chest pain as well as CMP, lipase, CBC, and CK. Toradol ordered for pain but patient refused this, 800 mg ibuprofen given instead.  EKG with NSR, CXR negative for acute cardiopulmonary process. Labs normal except for potassium mildly low at 3.4. Will replete with 20 meq K-dur prior to discharge.   Upon recheck patient feels well and is reassured by normal labs and imaging. He feels his  symptoms may be due to stress which I think is very likely. Asked him to follow up with PCP about the low potassium and his anxiety. He is agreeable to plan and stable for discharge home.   Final Clinical Impressions(s) / ED Diagnoses   Final diagnoses:  Left arm pain  Anxiety  Hypokalemia    ED Discharge Orders    None      Dolores Patty, DO PGY-3, [facility] 04/29/2018 1:17 PM    Tillman Sers, DO 04/29/18 1317    Jacalyn Lefevre, MD 04/29/18 1327

## 2018-04-29 NOTE — ED Triage Notes (Signed)
C/o bilat UE pain x 4 days-left worse than right-denies injury-NAD-steady gait

## 2018-07-05 ENCOUNTER — Ambulatory Visit: Payer: 59 | Admitting: Family Medicine

## 2018-07-05 ENCOUNTER — Encounter: Payer: Self-pay | Admitting: Family Medicine

## 2018-07-05 ENCOUNTER — Other Ambulatory Visit (HOSPITAL_COMMUNITY)
Admission: RE | Admit: 2018-07-05 | Discharge: 2018-07-05 | Disposition: A | Discharge status: Home / Self Care | Payer: 59 | Source: Ambulatory Visit | Attending: Family Medicine | Admitting: Family Medicine

## 2018-07-05 VITALS — BP 134/88 | HR 68 | Temp 98.1°F | Ht 76.0 in | Wt 221.8 lb

## 2018-07-05 DIAGNOSIS — Z118 Encounter for screening for other infectious and parasitic diseases: Secondary | ICD-10-CM

## 2018-07-05 DIAGNOSIS — R634 Abnormal weight loss: Secondary | ICD-10-CM | POA: Diagnosis not present

## 2018-07-05 DIAGNOSIS — J392 Other diseases of pharynx: Secondary | ICD-10-CM

## 2018-07-05 DIAGNOSIS — F324 Major depressive disorder, single episode, in partial remission: Secondary | ICD-10-CM

## 2018-07-05 DIAGNOSIS — Z113 Encounter for screening for infections with a predominantly sexual mode of transmission: Secondary | ICD-10-CM | POA: Insufficient documentation

## 2018-07-05 DIAGNOSIS — F325 Major depressive disorder, single episode, in full remission: Secondary | ICD-10-CM | POA: Insufficient documentation

## 2018-07-05 DIAGNOSIS — Z23 Encounter for immunization: Secondary | ICD-10-CM

## 2018-07-05 DIAGNOSIS — I10 Essential (primary) hypertension: Secondary | ICD-10-CM | POA: Insufficient documentation

## 2018-07-05 DIAGNOSIS — Z114 Encounter for screening for human immunodeficiency virus [HIV]: Secondary | ICD-10-CM

## 2018-07-05 LAB — COMPREHENSIVE METABOLIC PANEL
ALT: 30 U/L (ref 0–53)
AST: 21 U/L (ref 0–37)
Albumin: 4.6 g/dL (ref 3.5–5.2)
Alkaline Phosphatase: 64 U/L (ref 39–117)
BUN: 15 mg/dL (ref 6–23)
CHLORIDE: 102 meq/L (ref 96–112)
CO2: 30 meq/L (ref 19–32)
Calcium: 9.6 mg/dL (ref 8.4–10.5)
Creatinine, Ser: 1.19 mg/dL (ref 0.40–1.50)
GFR: 83.89 mL/min (ref >60.00)
GLUCOSE: 93 mg/dL (ref 70–99)
POTASSIUM: 4 meq/L (ref 3.5–5.1)
SODIUM: 138 meq/L (ref 135–145)
Total Bilirubin: 0.7 mg/dL (ref 0.2–1.2)
Total Protein: 7.7 g/dL (ref 6.0–8.3)

## 2018-07-05 LAB — CBC WITH DIFFERENTIAL/PLATELET
BASOS PCT: 0.5 % (ref 0.0–3.0)
Basophils Absolute: 0 10*3/uL (ref 0.0–0.1)
EOS PCT: 0.9 % (ref 0.0–5.0)
Eosinophils Absolute: 0 10*3/uL (ref 0.0–0.7)
HCT: 42.9 % (ref 39.0–52.0)
Hemoglobin: 13.8 g/dL (ref 13.0–17.0)
LYMPHS ABS: 1.5 10*3/uL (ref 0.7–4.0)
Lymphocytes Relative: 31.3 % (ref 12.0–46.0)
MCHC: 32.2 g/dL (ref 30.0–36.0)
MCV: 80.8 fl (ref 78.0–100.0)
MONO ABS: 0.6 10*3/uL (ref 0.1–1.0)
Monocytes Relative: 12.4 % — ABNORMAL HIGH (ref 3.0–12.0)
NEUTROS PCT: 54.9 % (ref 43.0–77.0)
Neutro Abs: 2.6 10*3/uL (ref 1.4–7.7)
Platelets: 208 10*3/uL (ref 150.0–400.0)
RBC: 5.31 Mil/uL (ref 4.22–5.81)
RDW: 13.6 % (ref 11.5–15.5)
WBC: 4.8 10*3/uL (ref 4.0–10.5)

## 2018-07-05 LAB — LIPID PANEL
CHOL/HDL RATIO: 3
Cholesterol: 115 mg/dL (ref 0–200)
HDL: 44.4 mg/dL (ref >39.00)
LDL Cholesterol: 62 mg/dL (ref 0–99)
NONHDL: 71.05
Triglycerides: 47 mg/dL (ref 0.0–149.0)
VLDL: 9.4 mg/dL (ref 0.0–40.0)

## 2018-07-05 LAB — TSH: TSH: 0.46 u[IU]/mL (ref 0.35–4.50)

## 2018-07-05 MED ORDER — AMLODIPINE BESYLATE 10 MG PO TABS: 10.0000 mg | ORAL_TABLET | Freq: Every day | ORAL | 2 refills | Status: DC
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
## 2018-07-05 NOTE — Assessment & Plan Note (Signed)
S:2-3 years ago was 240-reports has had about 20 pounds weight loss in that timeframe and he states it was not intentional.low appetite over last 6 months but has had no weight loss.  Feels like thinning out Denies new medicines in that time frame  Doesn't sleep well, cant eat, night sweats nightly. No lymphadenopathy Was using marijuana to help him eat and go to sleep -Saw hematology for weight loss Oct 21, 2017- their notes include negative tissue transglutaminase, EGD with gastritis, CT of chest with reflux findings.  TSH was normal in regards to weight loss, CT chest was negative for malignancy. A/P: Weight has been stable over at least a 1-month.-We will continue to monitor.  Patient's OCD may be making him focus heavily on symptoms-attempted to provide reassurance today.  We will also get blood work to make sure stable -Does have intermittent night sweats which we can monitor-given prior work-up do not consider referral back to hematology ideal at this time
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
## 2018-07-05 NOTE — Patient Instructions (Addendum)
Flu shot or tetanus shot today?  Please stop by lab before you go If you do not have mychart- we will call you about results within 5 business days of [place] receiving them.  If you have mychart- we will send your results within 3 business days of [place] receiving them.  If abnormal or we want to clarify a result, we will call or mychart you to make sure you receive the message.  If you have questions or concerns or don't hear within 5-7 days, please send [place] a message or call us.   Please schedule a visit with ENT at [facility] for the hearing issues as well as follow up on the phlegm  Start with Dr. Rene Kocher- can use other potential psychiatrists or counselors if needed. It may take a few months to get in  Why dont we check in a month from now to see how you are doing

## 2018-07-05 NOTE — Assessment & Plan Note (Signed)
S:Cough and throat irritation are a continued issue for him.  Through ENT consideration of GERD- omeprazole 40mg  BID- thought cough and congestion could be related to reflux.Cough has improved. Throat irritation is still there. Thick phlegm.Frustrated that he has to spit it out. Just wants it gone.  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

## 2018-07-05 NOTE — Assessment & Plan Note (Signed)
S: compliant with amlodipine 10mg - took mid day yesterday A/P: controlled today- he was actually surprised by this

## 2018-07-06 ENCOUNTER — Encounter: Payer: Self-pay | Admitting: Family Medicine

## 2018-07-06 ENCOUNTER — Telehealth: Payer: Self-pay

## 2018-07-06 ENCOUNTER — Other Ambulatory Visit: Payer: Self-pay

## 2018-07-06 DIAGNOSIS — H9192 Unspecified hearing loss, left ear: Secondary | ICD-10-CM

## 2018-07-06 DIAGNOSIS — J392 Other diseases of pharynx: Secondary | ICD-10-CM

## 2018-07-06 LAB — URINE CYTOLOGY ANCILLARY ONLY
CHLAMYDIA, DNA PROBE: NEGATIVE
NEISSERIA GONORRHEA: NEGATIVE
TRICH (WINDOWPATH): NEGATIVE

## 2018-07-06 LAB — HIV ANTIBODY (ROUTINE TESTING W REFLEX): HIV 1&2 Ab, 4th Generation: NONREACTIVE

## 2018-07-06 LAB — RPR: RPR Ser Ql: NONREACTIVE

## 2018-07-06 MED ORDER — AMLODIPINE BESYLATE 10 MG PO TABS: 10.0000 mg | ORAL_TABLET | Freq: Every day | ORAL | 2 refills | Status: DC

## 2018-07-06 NOTE — Telephone Encounter (Signed)
Referral placed as requested.

## 2018-07-06 NOTE — Telephone Encounter (Signed)
Called patient and spoke with him and he verbalized understanding of referral placed with Dr. Rene Kocher. He also asked that we refer him to an ENT as the one he was looking at is out of network.

## 2018-07-06 NOTE — Telephone Encounter (Signed)
You can refer to ENT under throat irritation/hearing loss left ear

## 2018-07-06 NOTE — Addendum Note (Signed)
Addended by: Shelva Majestic on: 07/06/2018 05:38 PM   Modules accepted: Orders

## 2018-07-06 NOTE — Telephone Encounter (Signed)
-----   Message from Shelva Majestic, MD sent at 07/05/2018  8:04 PM EST ----- Can you call patient and inform him I placed a referral for Dr. Gwendel Hanson realized when I looked at his website that he does require referrals.  Thanks, Tana Conch

## 2018-07-07 NOTE — Telephone Encounter (Signed)
Please advise 

## 2018-07-26 LAB — LIPID PANEL
Cholesterol: 123 (ref 0–200)
HDL: 60 (ref 35–70)
LDL Cholesterol: 17
Triglycerides: 232 — AB (ref 40–160)

## 2018-08-02 ENCOUNTER — Ambulatory Visit (INDEPENDENT_AMBULATORY_CARE_PROVIDER_SITE_OTHER): Payer: 59 | Admitting: Family Medicine

## 2018-08-02 ENCOUNTER — Encounter: Payer: Self-pay | Admitting: Family Medicine

## 2018-08-02 VITALS — BP 120/60 | HR 88 | Temp 98.8°F | Ht 76.0 in | Wt 218.0 lb

## 2018-08-02 DIAGNOSIS — R6889 Other general symptoms and signs: Secondary | ICD-10-CM

## 2018-08-02 DIAGNOSIS — F324 Major depressive disorder, single episode, in partial remission: Secondary | ICD-10-CM | POA: Diagnosis not present

## 2018-08-02 DIAGNOSIS — E538 Deficiency of other specified B group vitamins: Secondary | ICD-10-CM

## 2018-08-02 DIAGNOSIS — R05 Cough: Secondary | ICD-10-CM | POA: Diagnosis not present

## 2018-08-02 DIAGNOSIS — R52 Pain, unspecified: Secondary | ICD-10-CM | POA: Diagnosis not present

## 2018-08-02 DIAGNOSIS — I10 Essential (primary) hypertension: Secondary | ICD-10-CM

## 2018-08-02 DIAGNOSIS — R059 Cough, unspecified: Secondary | ICD-10-CM

## 2018-08-02 LAB — POCT INFLUENZA A/B
Influenza A, POC: NEGATIVE
Influenza B, POC: NEGATIVE

## 2018-08-02 NOTE — Patient Instructions (Addendum)
Viral illness/flu like illness but flu test negative. Would recommend rest, staying well hydrated. Usually lasts 7-10 days. Happy to see you if lasts beyond 2 weeks or worsening symptoms noted before then. Honey is reasonable for cough or cough drops or dextromethorphan/delsym also reasonable. Do not take a decongestant ans it can raise blood pressure  Glad you are doing better and have follow up with Dr. Rene Kocher today  Blood pressure looks good again

## 2018-08-02 NOTE — Progress Notes (Signed)
Phone [phone]   Subjective:  Ross Ryan is a 38 y.o. year old very pleasant male patient who presents for/with See problem oriented charting ROS-low-grade temperature elevations noted.  No measured fevers.  Still with some depressed mood but improved.  Still with some anxiety but improved.  No chest pain or shortness of breath reported.  Past Medical History-  Patient Active Problem List   Diagnosis Date Noted  . 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 Hypertension 07/05/2018    Priority: Medium  . Throat irritation 07/05/2018    Priority: Medium  . Weight loss 07/05/2018    Priority: Medium  . 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 Vitamin B12 deficiency - oral repletion  02/02/2017    Priority: Low  . 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 amLODipine (NORVASC) 10 MG tablet Take 1 tablet (10 mg total) by mouth daily. 90 tablet 2  . clobetasol ointment (TEMOVATE) 0.05 % Use once daily only on affected areas. Do not use on normal skin.    Marland Kitchen FLUoxetine (PROZAC) 20 MG capsule TAKE 1 CAPSULE BY MOUTH EVERY DAY IN THE MORNING    . folic acid (FOLVITE) 1 MG tablet Take 1 mg by mouth daily.    Marland Kitchen ibuprofen (ADVIL,MOTRIN) 600 MG tablet Take 1 tablet (600 mg total) by mouth every 6 (six) hours as needed. 30 tablet 0  . mirtazapine (REMERON) 15 MG tablet TAKE 1/2 - 1 TABLET BY MOUTH AT BEDTIME AS NEEDED SLEEP  1  . omeprazole (PRILOSEC) 40 MG capsule TAKE 1 CAPSULE (40 MG TOTAL) BY MOUTH 2 TIMES DAILY BEFORE MEALS.    . vitamin B-12 (CYANOCOBALAMIN) 250 MCG tablet Take 250 mcg by mouth daily.     No current facility-administered medications for this visit.      Objective:  BP 120/60 (BP Location: Left Arm, Patient Position:  Sitting, Cuff Size: Large)   Pulse 88   Temp 98.8 F (37.1 C) (Oral)   Ht 6\' 4"  (1.93 m)   Wt 218 lb (98.9 kg)   SpO2 99%   BMI 26.54 kg/m  Gen: Appears somewhat fatigued Pharynx with mild erythema, nasal turbinates with mild erythema with minimal discharge No cervical lymphadenopathy CV: RRR no murmurs rubs or gallops Lungs: CTAB no crackles, wheeze, rhonchi Abdomen: soft/nontender/nondistended/normal bowel sounds..  Ext: no edema Skin: warm, dry Neuro: Gait and speech normal    Assessment and Plan   #Hypertension S: Compliant with amlodipine 10 mg.  Well-controlled today A/P: Stable-continue current medication   #Depression- on Prozac.  Also with OCD/anxiety S: Poor control last visit with PHQ 9 of 19-recommended psychiatry follow-up with Dr. Rene Kocher. He is on prozac 20mg - he confirmed this.   Anxiety and depression have been better. Has appointment later today at 1 PM.  A/P: Thankful patient has made some improvements-also thankful for psychiatry follow-up given at least mild failur but e on SSRI.  I think he can also likely benefit from counseling given traumatic prior event.   #B12 deficiency S: Patient reports history of B12 deficiency in the past.  He has been on oral repletion but is recently stopped this. A/P: Suspect stable-with next blood work would update B12 level.  I did recommend he restart his 250 mcg  daily of vitamin B12 to be on the safe side.    Cough, body aches  s: was in [place] this last weekend and was around several folks 2 days of symptoms. Feels somewhat achy- particularly in left upper arm. Nauseated/tired. States low grade temperature elevations. Some cough, congestion. Yellow sputum- states not a lot of nasal issues.  -Denies international travel.  No known contacts with covid-19. - Previously concerned about weight loss.Weight has been stable over 6 months at last visit-down 3 pounds today over the last month-but has been ill over the last few  days - Describes mild dryness in the groin A/P: From AVS "  Patient Instructions  Viral illness/flu like illness but flu test negative. Would recommend rest, staying well hydrated. Usually lasts 7-10 days. Happy to see you if lasts beyond 2 weeks or worsening symptoms noted before then. Honey is reasonable for cough or cough drops or dextromethorphan/delsym also reasonable. Do not take a decongestant ans it can raise blood pressure  "    Future Appointments  Date Time Provider Department Center  11/02/2018 11:00 AM Shelva Majestic, MD [facility]   Return in about 3 months (around 11/02/2018) for follow up- or sooner if needed.  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  Tana Conch, MD

## 2018-08-10 ENCOUNTER — Encounter: Payer: Self-pay | Admitting: Family Medicine

## 2018-08-23 ENCOUNTER — Encounter: Payer: Self-pay | Admitting: Family Medicine

## 2018-08-26 ENCOUNTER — Emergency Department (HOSPITAL_BASED_OUTPATIENT_CLINIC_OR_DEPARTMENT_OTHER)
Admission: EM | Admit: 2018-08-26 | Discharge: 2018-08-26 | Disposition: A | Discharge status: Home / Self Care | Payer: 59 | Attending: Emergency Medicine | Admitting: Emergency Medicine

## 2018-08-26 ENCOUNTER — Encounter (HOSPITAL_BASED_OUTPATIENT_CLINIC_OR_DEPARTMENT_OTHER): Payer: Self-pay | Admitting: *Deleted

## 2018-08-26 ENCOUNTER — Other Ambulatory Visit: Payer: Self-pay

## 2018-08-26 DIAGNOSIS — F17290 Nicotine dependence, other tobacco product, uncomplicated: Secondary | ICD-10-CM | POA: Diagnosis not present

## 2018-08-26 DIAGNOSIS — I10 Essential (primary) hypertension: Secondary | ICD-10-CM | POA: Diagnosis not present

## 2018-08-26 DIAGNOSIS — Z79899 Other long term (current) drug therapy: Secondary | ICD-10-CM | POA: Diagnosis not present

## 2018-08-26 DIAGNOSIS — G43001 Migraine without aura, not intractable, with status migrainosus: Secondary | ICD-10-CM | POA: Insufficient documentation

## 2018-08-26 DIAGNOSIS — R51 Headache: Secondary | ICD-10-CM | POA: Diagnosis present

## 2018-08-26 MED ORDER — ACETAMINOPHEN 500 MG PO TABS
1000.0000 mg | ORAL_TABLET | Freq: Once | ORAL | Status: AC
  Administered 2018-08-26: 1000 mg via ORAL
  Filled 2018-08-26: qty 2

## 2018-08-26 MED ORDER — KETOROLAC TROMETHAMINE 60 MG/2ML IM SOLN
60.0000 mg | Freq: Once | INTRAMUSCULAR | Status: AC
  Administered 2018-08-26: 60 mg via INTRAMUSCULAR
  Filled 2018-08-26: qty 2

## 2018-08-26 MED ORDER — METOCLOPRAMIDE HCL 10 MG PO TABS: 10.0000 mg | ORAL_TABLET | Freq: Four times a day (QID) | ORAL | 0 refills | Status: DC | PRN

## 2018-08-26 NOTE — ED Triage Notes (Signed)
Headache for almost 2 weeks. . No fever, cough or SOB.

## 2018-08-26 NOTE — ED Provider Notes (Signed)
[facility] EMERGENCY DEPARTMENT Provider Note   CSN: [id number] Arrival date & time: 08/26/18  2058    History   Chief Complaint Chief Complaint  Patient presents with  . Headache    HPI Ross Ryan is a 38 y.o. male.     Patient presents with intermittent headache from most 2 weeks.  Gradual onset and worsening shortly after head fluoxetine increased to 20 mg.  No fevers or chills.  No cough or shortness of breath.  Patient is high blood pressure history is compliant with his medications.  Right sided location with light sensitivity.     Past Medical History:  Diagnosis Date  . Hypertension 12/2016    Patient Active Problem List   Diagnosis Date Noted  . 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 Hypertension 07/05/2018  . Throat irritation 07/05/2018  . Weight loss 07/05/2018  . 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[facility]  04/02/2017  . Vitamin B12 deficiency - oral repletion  02/02/2017  . 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 Fatty tumor removed from right Posterior forearm Right 2010  . KNEE SURGERY          Home Medications    Prior to Admission medications   Medication Sig Start Date End Date Taking? Authorizing Provider  amLODipine (NORVASC) 10 MG tablet Take 1 tablet (10 mg total) by mouth daily. 07/06/18   Shelva Majestic, MD  clobetasol ointment (TEMOVATE) 0.05 % Use once daily only on affected areas. Do not use on normal skin. 04/13/18   [provider]  FLUoxetine (PROZAC) 20 MG capsule TAKE 1 CAPSULE BY MOUTH EVERY DAY IN THE MORNING 06/23/18   [provider]  folic acid (FOLVITE) 1 MG tablet Take 1 mg by mouth daily.    [provider]  ibuprofen (ADVIL,MOTRIN) 600 MG tablet Take 1 tablet (600 mg total) by mouth every 6 (six) hours as needed. 09/11/17   Law, Waylan Boga, PA-C   metoCLOPramide (REGLAN) 10 MG tablet Take 1 tablet (10 mg total) by mouth every 6 (six) hours as needed for nausea (nausea/headache). 08/26/18   Blane Ohara, MD  mirtazapine (REMERON) 15 MG tablet TAKE 1/2 - 1 TABLET BY MOUTH AT BEDTIME AS NEEDED SLEEP 04/02/18   [provider]  omeprazole (PRILOSEC) 40 MG capsule TAKE 1 CAPSULE (40 MG TOTAL) BY MOUTH 2 TIMES DAILY BEFORE MEALS. 06/16/18   [provider]  vitamin B-12 (CYANOCOBALAMIN) 250 MCG tablet Take 250 mcg by mouth daily.    [provider]    Family History Family History  Problem Relation Age of Onset  . Hypertension Mother   . Diabetes Father   . Hyperlipidemia Father   . Hypertension Father   . Healthy Sister   . Healthy Brother   . Dementia Maternal Grandmother   . Other Maternal Grandfather        doesnt know cause of death  . Heart disease Paternal Grandmother   . Other Paternal Grandfather        doesnt know cause of death    Social History Social History   Tobacco Use  . Smoking status: Former Smoker    Types: Cigars    Last attempt to quit: 07/05/2018    Years since quitting: 0.1  . Smokeless tobacco: Never Used  . Tobacco comment: smokes cigars socially- encouraged cessation  Substance Use Topics  .  Alcohol use: Yes    Comment: social  . Drug use: Not Currently    Types: Marijuana    Comment: quit smoking marijuana 2020 january     Allergies   Patient has no known allergies.   Review of Systems Review of Systems  Constitutional: Negative for chills and fever.  HENT: Negative for congestion.   Eyes: Positive for photophobia. Negative for visual disturbance.  Respiratory: Negative for shortness of breath.   Cardiovascular: Negative for chest pain.  Gastrointestinal: Negative for abdominal pain and vomiting.  Genitourinary: Negative for dysuria and flank pain.  Musculoskeletal: Negative for back pain, neck pain and neck stiffness.  Skin: Negative for rash.   Neurological: Positive for headaches. Negative for light-headedness.     Physical Exam Updated Vital Signs BP (!) 168/99 (BP Location: Left Arm)   Pulse 68   Temp 98.2 F (36.8 C) (Oral)   Resp 16   Ht 6\' 4"  (1.93 m)   Wt 101.6 kg   SpO2 100%   BMI 27.27 kg/m   Physical Exam Vitals signs and nursing note reviewed.  Constitutional:      Appearance: He is well-developed.  HENT:     Head: Normocephalic and atraumatic.  Eyes:     General:        Right eye: No discharge.        Left eye: No discharge.     Conjunctiva/sclera: Conjunctivae normal.  Neck:     Musculoskeletal: Normal range of motion and neck supple.     Trachea: No tracheal deviation.  Cardiovascular:     Rate and Rhythm: Normal rate and regular rhythm.  Pulmonary:     Effort: Pulmonary effort is normal.     Breath sounds: Normal breath sounds.  Abdominal:     General: There is no distension.     Palpations: Abdomen is soft.     Tenderness: There is no abdominal tenderness. There is no guarding.  Skin:    General: Skin is warm.     Findings: No rash.  Neurological:     Mental Status: He is alert and oriented to person, place, and time.     GCS: GCS eye subscore is 4. GCS verbal subscore is 5. GCS motor subscore is 6.     Cranial Nerves: Cranial nerves are intact.     Comments: 5+ strength in UE and LE with f/e at major joints. Sensation to palpation intact in UE and LE. CNs 2-12 grossly intact.  EOMFI.  PERRL.   Finger nose and coordination intact bilateral.   Visual fields intact to finger testing. No nystagmus No meningismus, no mastoid tenderness, no trismus       ED Treatments / Results  Labs (all labs ordered are listed, but only abnormal results are displayed) Labs Reviewed - No data to display  EKG None  Radiology No results found.  Procedures Procedures (including critical care time)  Medications Ordered in ED Medications  ketorolac (TORADOL) injection 60 mg (has no  administration in time range)  acetaminophen (TYLENOL) tablet 1,000 mg (has no administration in time range)     Initial Impression / Assessment and Plan / ED Course  I have reviewed the triage vital signs and the nursing notes.  Pertinent labs & imaging results that were available during my care of the patient were reviewed by me and considered in my medical decision making (see chart for details).       Patient presents with 2 separate concerns likely related.  Patient has had intermittent headaches migraine-like since creasing dose of fluoxetine.  Discussed possibly related and also migraine type headache and description.  Patient has no signs of serious infection on exam.  Blood pressure elevated likely from essential hypertension with pain.  Discussed follow-up with primary doctor next week to recheck blood pressure and reassessment.  Toradol and Tylenol in the ER and Reglan with Benadryl at home as he is driving.  Final Clinical Impressions(s) / ED Diagnoses   Final diagnoses:  Migraine without aura and with status migrainosus, not intractable  Essential hypertension    ED Discharge Orders         Ordered    metoCLOPramide (REGLAN) 10 MG tablet  Every 6 hours PRN     08/26/18 2143           Blane Ohara, MD 08/26/18 2148

## 2018-08-26 NOTE — Discharge Instructions (Signed)
See a clinician if you develop neurologic symptoms such as loss of vision, weakness or numbness, fevers, neck stiffness or new concerns.  For headache you can try ibuprofen, Tylenol.  And if you take Reglan make sure you take Benadryl with it and remember it can make you sleepy so no driving or operating machinery. Have your blood pressure rechecked after the weekend.  Return for chest pain or shortness of breath.

## 2018-08-28 ENCOUNTER — Emergency Department (HOSPITAL_BASED_OUTPATIENT_CLINIC_OR_DEPARTMENT_OTHER)
Admission: EM | Admit: 2018-08-28 | Discharge: 2018-08-28 | Disposition: A | Discharge status: Home / Self Care | Payer: 59 | Attending: Emergency Medicine | Admitting: Emergency Medicine

## 2018-08-28 ENCOUNTER — Emergency Department (HOSPITAL_BASED_OUTPATIENT_CLINIC_OR_DEPARTMENT_OTHER): Payer: 59

## 2018-08-28 ENCOUNTER — Encounter (HOSPITAL_BASED_OUTPATIENT_CLINIC_OR_DEPARTMENT_OTHER): Payer: Self-pay

## 2018-08-28 ENCOUNTER — Other Ambulatory Visit: Payer: Self-pay

## 2018-08-28 DIAGNOSIS — I10 Essential (primary) hypertension: Secondary | ICD-10-CM | POA: Diagnosis not present

## 2018-08-28 DIAGNOSIS — Z79899 Other long term (current) drug therapy: Secondary | ICD-10-CM | POA: Diagnosis not present

## 2018-08-28 DIAGNOSIS — Z87891 Personal history of nicotine dependence: Secondary | ICD-10-CM | POA: Diagnosis not present

## 2018-08-28 DIAGNOSIS — F329 Major depressive disorder, single episode, unspecified: Secondary | ICD-10-CM | POA: Diagnosis not present

## 2018-08-28 DIAGNOSIS — M5481 Occipital neuralgia: Secondary | ICD-10-CM | POA: Diagnosis not present

## 2018-08-28 DIAGNOSIS — R51 Headache: Secondary | ICD-10-CM | POA: Diagnosis present

## 2018-08-28 LAB — COMPREHENSIVE METABOLIC PANEL
ALT: 35 U/L (ref 0–44)
AST: 28 U/L (ref 15–41)
Albumin: 4.1 g/dL (ref 3.5–5.0)
Alkaline Phosphatase: 61 U/L (ref 38–126)
Anion gap: 7 (ref 5–15)
BUN: 15 mg/dL (ref 6–20)
CO2: 24 mmol/L (ref 22–32)
Calcium: 9.3 mg/dL (ref 8.9–10.3)
Chloride: 106 mmol/L (ref 98–111)
Creatinine, Ser: 1.04 mg/dL (ref 0.61–1.24)
GFR calc Af Amer: >60 mL/min (ref >60)
GFR calc non Af Amer: >60 mL/min (ref >60)
Glucose, Bld: 119 mg/dL — ABNORMAL HIGH (ref 70–99)
Potassium: 3.3 mmol/L — ABNORMAL LOW (ref 3.5–5.1)
Sodium: 137 mmol/L (ref 135–145)
Total Bilirubin: 0.7 mg/dL (ref 0.3–1.2)
Total Protein: 7.6 g/dL (ref 6.5–8.1)

## 2018-08-28 LAB — CBC WITH DIFFERENTIAL/PLATELET
Abs Immature Granulocytes: 0.05 10*3/uL (ref 0.00–0.07)
Basophils Absolute: 0 10*3/uL (ref 0.0–0.1)
Basophils Relative: 0 %
Eosinophils Absolute: 0 10*3/uL (ref 0.0–0.5)
Eosinophils Relative: 0 %
HCT: 40.9 % (ref 39.0–52.0)
Hemoglobin: 12.7 g/dL — ABNORMAL LOW (ref 13.0–17.0)
Immature Granulocytes: 1 %
Lymphocytes Relative: 26 %
Lymphs Abs: 1.5 10*3/uL (ref 0.7–4.0)
MCH: 25.5 pg — ABNORMAL LOW (ref 26.0–34.0)
MCHC: 31.1 g/dL (ref 30.0–36.0)
MCV: 82 fL (ref 80.0–100.0)
Monocytes Absolute: 0.7 10*3/uL (ref 0.1–1.0)
Monocytes Relative: 12 %
Neutro Abs: 3.6 10*3/uL (ref 1.7–7.7)
Neutrophils Relative %: 61 %
Platelets: 207 10*3/uL (ref 150–400)
RBC: 4.99 MIL/uL (ref 4.22–5.81)
RDW: 14.6 % (ref 11.5–15.5)
WBC: 5.9 10*3/uL (ref 4.0–10.5)
nRBC: 0 % (ref 0.0–0.2)

## 2018-08-28 MED ORDER — BUPIVACAINE HCL 0.25 % IJ SOLN
5.0000 mL | Freq: Once | INTRAMUSCULAR | Status: AC
  Administered 2018-08-28: 5 mL
  Filled 2018-08-28: qty 1

## 2018-08-28 MED ORDER — SODIUM CHLORIDE 0.9 % IV BOLUS (SEPSIS)
1000.0000 mL | Freq: Once | INTRAVENOUS | Status: AC
  Administered 2018-08-28: 1000 mL via INTRAVENOUS

## 2018-08-28 MED ORDER — METOCLOPRAMIDE HCL 5 MG/ML IJ SOLN
5.0000 mg | Freq: Once | INTRAMUSCULAR | Status: AC
  Administered 2018-08-28: 14:00:00 5 mg via INTRAVENOUS
  Filled 2018-08-28: qty 2

## 2018-08-28 MED ORDER — DIPHENHYDRAMINE HCL 50 MG/ML IJ SOLN
25.0000 mg | Freq: Once | INTRAMUSCULAR | Status: AC
  Administered 2018-08-28: 14:00:00 25 mg via INTRAVENOUS
  Filled 2018-08-28: qty 1

## 2018-08-28 MED ORDER — KETOROLAC TROMETHAMINE 15 MG/ML IJ SOLN
15.0000 mg | Freq: Once | INTRAMUSCULAR | Status: AC
  Administered 2018-08-28: 15 mg via INTRAVENOUS
  Filled 2018-08-28: qty 1

## 2018-08-28 MED ORDER — TRIAMCINOLONE ACETONIDE 40 MG/ML IJ SUSP
40.0000 mg | Freq: Once | INTRAMUSCULAR | Status: AC
  Administered 2018-08-28: 40 mg via INTRADERMAL
  Filled 2018-08-28: qty 5

## 2018-08-28 NOTE — ED Provider Notes (Signed)
[facility] EMERGENCY DEPARTMENT Provider Note   CSN: [id number] Arrival date & time: 08/28/18  1259    History   Chief Complaint No chief complaint on file.   HPI Ross Ryan is a 38 y.o. male.     Patient is a 38 year old male with past medical history of depression, hypertension, B12 deficiency who presents to the emergency department for right-sided migraine headache.  Patient reports that the headache has been constant for several days.  Was seen a few days ago in the emergency department for the same.  He got relief with Reglan, Benadryl, Toradol.  Was thought that maybe this could be related to his increase in fluoxetine.  Reports that the headache has now come back and seems to be worse.  Reports that in the past he has had a history of mastoiditis and this feels the same.  The headache seems to be worse at night.  He reports that he also had associated right-sided blurry vision for some time but that has resolved.  The pain starts behind his right ear and radiates up into the right side of his face and right eye.  Denies any fever, chills, URI symptoms, cough, chest pain.     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 Authorizing Provider  amLODipine (NORVASC) 10 MG tablet Take 1 tablet (10  mg total) by mouth daily. 07/06/18   Shelva Majestic, MD  clobetasol ointment (TEMOVATE) 0.05 % Use once daily only on affected areas. Do not use on normal skin. 04/13/18   [provider]  FLUoxetine (PROZAC) 20 MG capsule TAKE 1 CAPSULE BY MOUTH EVERY DAY IN THE MORNING 06/23/18   [provider]  folic acid (FOLVITE) 1 MG tablet Take 1 mg by mouth daily.    [provider]  ibuprofen (ADVIL,MOTRIN) 600 MG tablet Take 1 tablet (600 mg total) by mouth every 6 (six) hours as needed. 09/11/17   Law, Waylan Boga, PA-C  metoCLOPramide (REGLAN) 10 MG tablet Take 1 tablet (10 mg total) by mouth every 6 (six) hours as needed for nausea (nausea/headache). 08/26/18   Blane Ohara, MD  mirtazapine (REMERON) 15 MG tablet TAKE 1/2 - 1 TABLET BY MOUTH AT BEDTIME AS NEEDED SLEEP 04/02/18   [provider]  omeprazole (PRILOSEC) 40 MG capsule TAKE 1 CAPSULE (40 MG TOTAL) BY MOUTH 2 TIMES DAILY BEFORE MEALS. 06/16/18   [provider]  vitamin B-12 (CYANOCOBALAMIN) 250 MCG tablet Take 250 mcg by mouth daily.    [provider]    Family History Family History  Problem Relation Age of Onset   Hypertension Mother    Diabetes Father    Hyperlipidemia Father    Hypertension Father    Healthy Sister    Healthy Brother  Dementia Maternal Grandmother    Other Maternal Grandfather        doesnt know cause of death   Heart disease Paternal Grandmother    Other Paternal Grandfather        doesnt know cause of death    Social History Social History   Tobacco Use   Smoking status: Former Smoker    Types: Cigars    Last attempt to quit: 07/05/2018    Years since quitting: 0.1   Smokeless tobacco: Never Used   Tobacco comment: smokes cigars socially- encouraged cessation  Substance Use Topics   Alcohol use: Yes    Comment: social   Drug use: Not Currently    Types: Marijuana    Comment: quit smoking marijuana 2020 january      Allergies   Patient has no known allergies.   Review of Systems Review of Systems  Constitutional: Negative for chills and fever.  HENT: Negative for ear pain and sore throat.   Eyes: Positive for photophobia and visual disturbance. Negative for pain, discharge, redness and itching.  Respiratory: Negative for cough and shortness of breath.   Cardiovascular: Negative for chest pain and palpitations.  Gastrointestinal: Negative for abdominal pain and vomiting.  Genitourinary: Negative for dysuria and hematuria.  Musculoskeletal: Negative for arthralgias and back pain.  Skin: Negative for color change and rash.  Neurological: Positive for headaches. Negative for dizziness, seizures, syncope and light-headedness.  All other systems reviewed and are negative.    Physical Exam Updated Vital Signs BP (!) 164/114 (BP Location: Right Arm)    Pulse 75    Temp 98.5 F (36.9 C)    Resp 17    Ht 6\' 4"  (1.93 m)    Wt 102.1 kg    SpO2 99%    BMI 27.39 kg/m   Physical Exam Vitals signs and nursing note reviewed.  Constitutional:      Appearance: Normal appearance.  HENT:     Head: Normocephalic.  Eyes:     Extraocular Movements: Extraocular movements intact.     Conjunctiva/sclera: Conjunctivae normal.     Pupils: Pupils are equal, round, and reactive to light.  Pulmonary:     Effort: Pulmonary effort is normal.  Skin:    General: Skin is dry.  Neurological:     General: No focal deficit present.     Mental Status: He is alert and oriented to person, place, and time.     Cranial Nerves: No cranial nerve deficit.     Sensory: No sensory deficit.     Motor: No weakness.  Psychiatric:        Mood and Affect: Mood normal.      ED Treatments / Results  Labs (all labs ordered are listed, but only abnormal results are displayed) Labs Reviewed  COMPREHENSIVE METABOLIC PANEL - Abnormal; Notable for the following components:      Result Value   Potassium 3.3 (*)    Glucose,  Bld 119 (*)    All other components within normal limits  CBC WITH DIFFERENTIAL/PLATELET - Abnormal; Notable for the following components:   Hemoglobin 12.7 (*)    MCH 25.5 (*)    All other components within normal limits    EKG None  Radiology Ct Head Wo Contrast  Result Date: 08/28/2018 CLINICAL DATA:  Right-sided headache for the past 2 weeks. EXAM: CT HEAD WITHOUT CONTRAST TECHNIQUE: Contiguous axial images were obtained from the base of the skull through the vertex without intravenous contrast. COMPARISON:  04/23/2010 FINDINGS: Brain: Gray-Brunson differentiation is maintained. No CT evidence of acute large territory infarct. No intraparenchymal or extra-axial mass or hemorrhage. Normal size and configuration of the ventricles and the basilar cisterns. No midline shift. Vascular: No hyperdense vessel or unexpected calcification. Skull: No displaced calvarial fracture. Sinuses/Orbits: There is chronic opacification the bilateral mastoid air cells, left greater than right, similar to the 03/2010 examination. Remaining paranasal sinuses are well aerated. No air-fluid levels. Other: Regional soft tissues appear normal. IMPRESSION: 1. Negative noncontrast head CT. 2. Chronic opacification of the bilateral mastoid air cells, left greater than right, similar to the 03/2010 examination. Electronically Signed   By: Simonne Come M.D.   On: 08/28/2018 14:16    Procedures .Nerve Block Date/Time: 08/28/2018 3:30 PM Performed by: Arlyn Dunning, PA-C Authorized by: Arlyn Dunning, PA-C   Consent:    Consent obtained:  Verbal   Consent given by:  Patient   Risks discussed:  Allergic reaction, infection, nerve damage, intravenous injection, bleeding, pain, unsuccessful block and swelling   Alternatives discussed:  Alternative treatment Indications:    Indications:  Pain relief Location:    Body area:  Head   Head nerve:  Occipital   Laterality:  Right Pre-procedure details:    Skin preparation:   Alcohol Procedure details (see MAR for exact dosages):    Block needle gauge:  25 G   Anesthetic injected:  Bupivacaine 0.25% w/o epi   Steroid injected:  Triamcinolone   Additive injected:  None   Injection procedure:  Anatomic landmarks identified, anatomic landmarks palpated, introduced needle, incremental injection and negative aspiration for blood   Paresthesia:  None Post-procedure details:    Dressing:  None   Outcome:  Pain relieved   Patient tolerance of procedure:  Tolerated well, no immediate complications   (including critical care time)  Medications Ordered in ED Medications  sodium chloride 0.9 % bolus 1,000 mL (1,000 mLs Intravenous New Bag/Given 08/28/18 1352)  metoCLOPramide (REGLAN) injection 5 mg (5 mg Intravenous Given 08/28/18 1356)  ketorolac (TORADOL) 15 MG/ML injection 15 mg (15 mg Intravenous Given 08/28/18 1359)  diphenhydrAMINE (BENADRYL) injection 25 mg (25 mg Intravenous Given 08/28/18 1354)  bupivacaine (MARCAINE) 0.25 % (with pres) injection 5 mL (5 mLs Infiltration Given by Other 08/28/18 1517)  triamcinolone acetonide (KENALOG-40) injection 40 mg (40 mg Intradermal Given by Other 08/28/18 1517)     Initial Impression / Assessment and Plan / ED Course  I have reviewed the triage vital signs and the nursing notes.  Pertinent labs & imaging results that were available during my care of the patient were reviewed by me and considered in my medical decision making (see chart for details).  Clinical Course as of Aug 27 1529  Sat Aug 28, 2018  1504 Case discussed with Dr. Clarene Duke.  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  I do not think this is acute needs to be treated.  I believe that the patient symptoms are more consistent with occipital neuralgia pain Toradol, Reglan, Benadryl.  However patient still has pain.  I discussed occipital nerve block risks and benefits with the patient he reports that he would like to try this  for relief   [KM]  1524 Patient got immediate relief of headache with occipital nerve block.  Patient was advised to continue medications as prescribed for headache at his past visit, if he feels the need to.  I will also refer to neurology if  patient continues to have ongoing headaches he may give them a call.  Patient with history of hypertension and blood pressure elevated today advised to follow-up with primary care doctor.   [KM]    Clinical Course User Index [KM] Arlyn Dunning, PA-C       Based on review of vitals, medical screening exam, lab work and/or imaging, there does not appear to be an acute, emergent etiology for the patient's symptoms. Counseled pt on good return precautions and encouraged both PCP and ED follow-up as needed.  Prior to discharge, I also discussed incidental imaging findings with patient in detail and advised appropriate, recommended follow-up in detail.  Clinical Impression: 1. Occipital neuralgia of right side     Disposition: Discharge  Prior to providing a prescription for a controlled substance, I independently reviewed the patient's recent prescription history on the [place] Controlled Substance Reporting System. The patient had no recent or regular prescriptions and was deemed appropriate for a brief, less than 3 day prescription of narcotic for acute analgesia.  This note was prepared with assistance of Conservation officer, historic buildings. Occasional wrong-word or sound-a-like substitutions may have occurred due to the inherent limitations of voice recognition software.   Final Clinical Impressions(s) / ED Diagnoses   Final diagnoses:  Occipital neuralgia of right side    ED Discharge Orders    None       Jeral Pinch 08/28/18 1531    Little, Ambrose Finland, MD 08/28/18 1535

## 2018-08-28 NOTE — ED Notes (Signed)
ED Provider at bedside. 

## 2018-08-28 NOTE — ED Notes (Signed)
Patient transported to ct

## 2018-08-28 NOTE — ED Triage Notes (Addendum)
Pt presents with ongoing headache to right Right side radiating into neck- hx of mastoiditis- feels this is the same. Recently seen and treated for migrane- Toradol improved symptoms for one day.

## 2018-08-28 NOTE — Discharge Instructions (Signed)
Thank you for allowing me to care for you today. Please return to the emergency department if you have new or worsening symptoms. Take your medications as instructed.  ° °

## 2018-11-02 ENCOUNTER — Ambulatory Visit (INDEPENDENT_AMBULATORY_CARE_PROVIDER_SITE_OTHER): Payer: 59 | Admitting: Family Medicine

## 2018-11-02 ENCOUNTER — Encounter: Payer: Self-pay | Admitting: Family Medicine

## 2018-11-02 VITALS — BP 127/62 | Ht 76.0 in | Wt 230.0 lb

## 2018-11-02 DIAGNOSIS — K219 Gastro-esophageal reflux disease without esophagitis: Secondary | ICD-10-CM

## 2018-11-02 DIAGNOSIS — J392 Other diseases of pharynx: Secondary | ICD-10-CM | POA: Diagnosis not present

## 2018-11-02 DIAGNOSIS — E663 Overweight: Secondary | ICD-10-CM

## 2018-11-02 DIAGNOSIS — I10 Essential (primary) hypertension: Secondary | ICD-10-CM

## 2018-11-02 DIAGNOSIS — R634 Abnormal weight loss: Secondary | ICD-10-CM

## 2018-11-02 DIAGNOSIS — F324 Major depressive disorder, single episode, in partial remission: Secondary | ICD-10-CM

## 2018-11-02 MED ORDER — AMLODIPINE BESYLATE 5 MG PO TABS: 5.0000 mg | ORAL_TABLET | Freq: Every day | ORAL | 3 refills | Status: DC

## 2018-11-02 MED ORDER — OMEPRAZOLE 40 MG PO CPDR: DELAYED_RELEASE_CAPSULE | ORAL | 3 refills | Status: DC

## 2018-11-02 NOTE — Assessment & Plan Note (Signed)
Likely was related to GERD given improvement on PPI-titrating medication down as able

## 2018-11-02 NOTE — Assessment & Plan Note (Signed)
S: controlled on  amlodipine 10mg  but admits does not take this consistently as he is felt like his blood pressure gets too low at times-has noted some lows on home cuff. - cant locate his home cuff right now. Last check at home was controlled- slightly high last time he was at the dentist BP Readings from Last 3 Encounters:  08/28/18 136/72  08/26/18 (!) 160/88  08/02/18 120/60  A/P: I believe taking medication consistently is important for patient-since he thinks he is having some lows I recommended lowering amlodipine to 5 mg and encouraged him to take this regularly.  He is going to try to find his home cuff and then update [place] with blood pressure.  He has been more consistent with exercise and trying to eat healthy and this may be helping the blood pressure -Recommended 77-month follow-up at the latest -Recommended home blood pressure to remain less than 140/90 on average

## 2018-11-02 NOTE — Progress Notes (Addendum)
Phone [phone]   Subjective:  Virtual visit via Video note. Chief complaint: Chief Complaint  Patient presents with  . Depression   This visit type was conducted due to national recommendations for restrictions regarding the COVID-19 Pandemic (e.g. social distancing).  This format is felt to be most appropriate for this patient at this time balancing risks to patient and risks to population by having him in for in person visit.  No physical exam was performed (except for noted visual exam or audio findings with Telehealth visits).    Our team/I connected with Ross Ryan at 11:00 AM EDT by a video enabled telemedicine application ([email or web address] or caregility through epic) and verified that I am speaking with the correct person using two identifiers.  Location patient: [facility] Location provider: [facility], office Persons participating in the virtual visit:  patient  Our team/I discussed the limitations of evaluation and management by telemedicine and the availability of in person appointments. In light of current covid-19 pandemic, patient also understands that we are trying to protect them by minimizing in office contact if at all possible.  The patient expressed consent for telemedicine visit and agreed to proceed. Patient understands insurance will be billed.   ROS- No fever, chills, cough, shortness of breath, body aches, sore throat, or loss of taste or smell  Past Medical History-  Patient Active Problem List   Diagnosis Date Noted  . Gastroesophageal reflux disease without esophagitis 11/02/2018    Priority: Medium  . Depression, major, single episode, in partial remission ([place])- on prozac 07/05/2018    Priority: Medium  . Hypertension 07/05/2018    Priority: Medium  . Throat irritation 07/05/2018    Priority: Low  . Weight loss 07/05/2018    Priority: Low  . 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 Vitamin B12 deficiency - oral  repletion  02/02/2017    Priority: Low  . Hereditary persistence of fetal hemoglobin ([place]) 12/31/2016    Priority: Low    Medications- reviewed and updated Current Outpatient Medications  Medication Sig Dispense Refill  . amLODipine (NORVASC) 5 MG tablet Take 1 tablet (5 mg total) by mouth daily. 90 tablet 3  . clobetasol ointment (TEMOVATE) 0.05 % Use once daily only on affected areas. Do not use on normal skin.    Marland Kitchen FLUoxetine (PROZAC) 20 MG capsule TAKE 1 CAPSULE BY MOUTH EVERY DAY IN THE MORNING    . ibuprofen (ADVIL,MOTRIN) 600 MG tablet Take 1 tablet (600 mg total) by mouth every 6 (six) hours as needed. 30 tablet 0  . omeprazole (PRILOSEC) 40 MG capsule TAKE 1 CAPSULE (40 MG TOTAL) BY MOUTH ONCE DAILY BEFORE BREAKFAST 90 capsule 3  . vitamin B-12 (CYANOCOBALAMIN) 250 MCG tablet Take 250 mcg by mouth daily.     No current facility-administered medications for this visit.      Objective:  Ht 6\' 4"  (1.93 m)   Wt 230 lb (104.3 kg)   BMI 28.00 kg/m  self reported vitals Gen: NAD, resting comfortably Lungs: nonlabored, normal respiratory rate  Skin: appears dry, no obvious rash    Assessment and Plan   # Depression S: compliant with prozac 20mg . hasnt felt this good in years. Has not had to do counseling.  - george floyd info has been hard on him- cant think about it too much because worried it may worsen depression Depression screen [facility] 2/9 11/02/2018  Decreased Interest 0  Down, Depressed, Hopeless 0  PHQ - 2 Score 0  Altered sleeping 0  Tired, decreased energy 0  Change in appetite 0  Feeling bad or failure about yourself  0  Trouble concentrating 0  Moving slowly or fidgety/restless 1  Suicidal thoughts 0  PHQ-9 Score 1  Difficult doing work/chores Not difficult at all  A/P: Depression appears to be in full remission- we will continue Prozac at 20 mg.  #hypertension S: controlled on  amlodipine 10mg  but admits does not take this consistently as he is felt like his  blood pressure gets too low at times-has noted some lows on home cuff. - cant locate his home cuff right now. Last check at home was controlled- slightly high last time he was at the dentist BP Readings from Last 3 Encounters:  11/02/18 127/62  08/28/18 136/72  08/26/18 (!) 160/88  A/P: I believe taking medication consistently is important for patient-since he thinks he is having some lows I recommended lowering amlodipine to 5 mg and encouraged him to take this regularly.  He is going to try to find his home cuff and then update us with blood pressure.  He has been more consistent with exercise and trying to eat healthy and this may be helping the blood pressure -Recommended 6221-month follow-up at the latest -Recommended home blood pressure to remain less than 140/90 on average # overweight S: was worried about weight loss before- has stabilized. Back working  Out. Doing some running yet not losing weight Wt Readings from Last 3 Encounters:  11/02/18 230 lb (104.3 kg)  08/28/18 225 lb (102.1 kg)  08/26/18 224 lb (101.6 kg)  A/P: Patient has reasonable muscle mass-I want him to continue his efforts for healthy eating and regular exercise.  Hoping weight will at least trend down due to intentional efforts-patient was reassured that he is no longer unintentionally losing weight.  #GERD S: Omeprazole listed as 40 mg twice daily-patient states he is tolerating 40 mg once a day without significant reflux-no reported return of throat clearing issues A/P:  Stable. Continue current medications.   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 omeprazole  (PRILOSEC) 40 MG capsule    Sig: TAKE 1 CAPSULE (40 MG TOTAL) BY MOUTH ONCE DAILY BEFORE BREAKFAST    Dispense:  90 capsule    Refill:  3  . amLODipine (NORVASC) 5 MG tablet    Sig: Take 1 tablet (5 mg total) by mouth daily.    Dispense:  90 tablet    Refill:  3   Return precautions advised.  Tana ConchStephen , MD

## 2018-11-02 NOTE — Assessment & Plan Note (Signed)
See overweight comments today-weight has stabilized

## 2018-11-02 NOTE — Patient Instructions (Addendum)
There are no preventive care reminders to display for this patient.  Depression screen [facility] 2/9 11/02/2018 07/05/2018  Decreased Interest 0 2  Down, Depressed, Hopeless 0 2  PHQ - 2 Score 0 4  Altered sleeping 0 3  Tired, decreased energy 0 3  Change in appetite 0 3  Feeling bad or failure about yourself  0 2  Trouble concentrating 0 2  Moving slowly or fidgety/restless 1 1  Suicidal thoughts 0 1  PHQ-9 Score 1 19  Difficult doing work/chores Not difficult at all -

## 2018-11-04 ENCOUNTER — Encounter: Payer: Self-pay | Admitting: Family Medicine

## 2018-11-05 NOTE — Telephone Encounter (Signed)
F.Y.I  Please see message.  Thank you.  

## 2018-12-23 ENCOUNTER — Encounter: Payer: Self-pay | Admitting: Family Medicine

## 2018-12-27 ENCOUNTER — Other Ambulatory Visit: Payer: Self-pay

## 2018-12-27 DIAGNOSIS — Z20822 Contact with and (suspected) exposure to covid-19: Secondary | ICD-10-CM

## 2018-12-29 LAB — NOVEL CORONAVIRUS, NAA: SARS-CoV-2, NAA: NOT DETECTED

## 2019-01-07 ENCOUNTER — Telehealth: Payer: Self-pay | Admitting: Family Medicine

## 2019-01-07 ENCOUNTER — Encounter: Payer: Self-pay | Admitting: Family Medicine

## 2019-01-07 NOTE — Telephone Encounter (Signed)
See note

## 2019-01-07 NOTE — Telephone Encounter (Signed)
Patient is calling to schedule an appt for STD testing Please advise CB- [phone]

## 2019-01-10 ENCOUNTER — Ambulatory Visit (INDEPENDENT_AMBULATORY_CARE_PROVIDER_SITE_OTHER): Payer: 59 | Admitting: Physician Assistant

## 2019-01-10 ENCOUNTER — Encounter: Payer: Self-pay | Admitting: Physician Assistant

## 2019-01-10 ENCOUNTER — Other Ambulatory Visit (HOSPITAL_COMMUNITY)
Admission: RE | Admit: 2019-01-10 | Discharge: 2019-01-10 | Disposition: A | Discharge status: Home / Self Care | Payer: 59 | Source: Ambulatory Visit | Attending: Physician Assistant | Admitting: Physician Assistant

## 2019-01-10 ENCOUNTER — Other Ambulatory Visit: Payer: Self-pay

## 2019-01-10 VITALS — BP 126/80 | HR 66 | Temp 98.4°F | Ht 76.0 in | Wt 239.4 lb

## 2019-01-10 DIAGNOSIS — Z202 Contact with and (suspected) exposure to infections with a predominantly sexual mode of transmission: Secondary | ICD-10-CM | POA: Diagnosis present

## 2019-01-10 MED ORDER — METRONIDAZOLE 500 MG PO TABS: 500.0000 mg | ORAL_TABLET | Freq: Two times a day (BID) | ORAL | 0 refills | Status: DC

## 2019-01-10 NOTE — Patient Instructions (Signed)
It was great to see you!  Start oral antibiotic (metronidazole). Do not drink while on this medication.  Please abstain from sex until you get all of your test results back.  Take care,  Inda Coke PA-C

## 2019-01-10 NOTE — Telephone Encounter (Signed)
Called patient and scheduled.

## 2019-01-10 NOTE — Progress Notes (Signed)
Ross Ryan is a 38 y.o. male here for a new problem.  I acted as a Education administrator for [employer], PA-C Anselmo Pickler, LPN  History of Present Illness:   Chief Complaint  Patient presents with  . STD testing    HPI   STD testing Pt found out his partner has Trich on Friday and she was started on antibiotics. Pt denies any symptoms. He does not use condoms with this partner. He states that he is monogamous with this partner and she has told him that she is monogamous.  He denies prior STDs.    Past Medical History:  Diagnosis Date  . Hypertension 12/2016     Social History   Socioeconomic History  . Marital status: Single    Spouse name: Not on file  . Number of children: Not on file  . Years of education: Not on file  . Highest education level: Not on file  Occupational History  . Not on file  Social Needs  . Financial resource strain: Not on file  . Food insecurity    Worry: Not on file    Inability: Not on file  . Transportation needs    Medical: Not on file    Non-medical: Not on file  Tobacco Use  . Smoking status: Former Smoker    Types: Cigars    Quit date: 07/05/2018    Years since quitting: 0.5  . Smokeless tobacco: Never Used  . Tobacco comment: smokes cigars socially- encouraged cessation  Substance and Sexual Activity  . Alcohol use: Yes    Comment: social  . Drug use: Not Currently    Types: Marijuana    Comment: quit smoking marijuana 2020 january  . Sexual activity: Yes    Birth control/protection: Condom  Lifestyle  . Physical activity    Days per week: Not on file    Minutes per session: Not on file  . Stress: Not on file  Relationships  . Social Herbalist on phone: Not on file    Gets together: Not on file    Attends religious service: Not on file    Active member of club or organization: Not on file    Attends meetings of clubs or organizations: Not on file    Relationship status: Not on file  . Intimate partner  violence    Fear of current or ex partner: Not on file    Emotionally abused: Not on file    Physically abused: Not on file    Forced sexual activity: Not on file  Other Topics Concern  . Not on file  Social History Narrative  . Not on file    Past Surgical History:  Procedure Laterality Date  . Fatty tumor removed from right Posterior forearm Right 2010  . KNEE SURGERY      Family History  Problem Relation Age of Onset  . Hypertension Mother   . Diabetes Father   . Hyperlipidemia Father   . Hypertension Father   . Healthy Sister   . Healthy Brother   . Dementia Maternal Grandmother   . Other Maternal Grandfather        doesnt know cause of death  . Heart disease Paternal Grandmother   . Other Paternal Grandfather        doesnt know cause of death    No Known Allergies  Current Medications:   Current Outpatient Medications:  .  amLODipine (NORVASC) 5 MG tablet, Take 1 tablet (5  mg total) by mouth daily., Disp: 90 tablet, Rfl: 3 .  clobetasol ointment (TEMOVATE) 0.05 %, Use once daily only on affected areas. Do not use on normal skin., Disp: , Rfl:  .  FLUoxetine (PROZAC) 20 MG capsule, TAKE 1 CAPSULE BY MOUTH EVERY DAY IN THE MORNING, Disp: , Rfl:  .  ibuprofen (ADVIL,MOTRIN) 600 MG tablet, Take 1 tablet (600 mg total) by mouth every 6 (six) hours as needed., Disp: 30 tablet, Rfl: 0 .  omeprazole (PRILOSEC) 40 MG capsule, TAKE 1 CAPSULE (40 MG TOTAL) BY MOUTH ONCE DAILY BEFORE BREAKFAST, Disp: 90 capsule, Rfl: 3 .  vitamin B-12 (CYANOCOBALAMIN) 250 MCG tablet, Take 250 mcg by mouth daily., Disp: , Rfl:  .  metroNIDAZOLE (FLAGYL) 500 MG tablet, Take 1 tablet (500 mg total) by mouth 2 (two) times daily., Disp: 14 tablet, Rfl: 0   Review of Systems:   ROS Negative unless otherwise specified per HPI.  Vitals:   Vitals:   01/10/19 1502  BP: 126/80  Pulse: 66  Temp: 98.4 F (36.9 C)  TempSrc: Oral  SpO2: 98%  Weight: 239 lb 6.1 oz (108.6 kg)  Height: 6\' 4"   (1.93 m)     Body mass index is 29.14 kg/m.  Physical Exam:   Physical Exam Vitals signs and nursing note reviewed.  Constitutional:      General: He is not in acute distress.    Appearance: He is well-developed. He is not ill-appearing or toxic-appearing.  Cardiovascular:     Rate and Rhythm: Normal rate and regular rhythm.     Pulses: Normal pulses.     Heart sounds: Normal heart sounds, S1 normal and S2 normal.     Comments: No LE edema Pulmonary:     Effort: Pulmonary effort is normal.     Breath sounds: Normal breath sounds.  Skin:    General: Skin is warm and dry.  Neurological:     Mental Status: He is alert.     GCS: GCS eye subscore is 4. GCS verbal subscore is 5. GCS motor subscore is 6.  Psychiatric:        Speech: Speech normal.        Behavior: Behavior normal. Behavior is cooperative.       Assessment and Plan:   Ross Ryan was seen today for std testing.  Diagnoses and all orders for this visit:  STD exposure Will empirically treat with flagyl. Avoid alcohol due to abx. Discussed importance of wearing condoms. Encouraged abstinence until STD tests are results. Will modify/add treatment if needed based on lab results. -     Urine cytology ancillary only -     HIV Antibody (routine testing w rflx) -     RPR  Other orders -     metroNIDAZOLE (FLAGYL) 500 MG tablet; Take 1 tablet (500 mg total) by mouth 2 (two) times daily.    . Reviewed expectations re: course of current medical issues. . Discussed self-management of symptoms. . Outlined signs and symptoms indicating need for more acute intervention. . Patient verbalized understanding and all questions were answered. . See orders for this visit as documented in the electronic medical record. . Patient received an After-Visit Summary.  CMA or LPN served as scribe during this visit. History, Physical, and Plan performed by medical provider. The above documentation has been reviewed and is accurate and  complete.  Jarold MottoSamantha Shamieka Gullo, PA-C

## 2019-01-11 LAB — HIV ANTIBODY (ROUTINE TESTING W REFLEX): HIV 1&2 Ab, 4th Generation: NONREACTIVE

## 2019-01-11 LAB — RPR: RPR Ser Ql: NONREACTIVE

## 2019-01-12 ENCOUNTER — Encounter: Payer: Self-pay | Admitting: Physician Assistant

## 2019-01-12 LAB — URINE CYTOLOGY ANCILLARY ONLY
Chlamydia: NEGATIVE
Neisseria Gonorrhea: NEGATIVE
Trichomonas: NEGATIVE

## 2019-02-07 ENCOUNTER — Encounter: Payer: Self-pay | Admitting: Physician Assistant

## 2019-02-07 ENCOUNTER — Other Ambulatory Visit: Payer: Self-pay

## 2019-02-07 ENCOUNTER — Ambulatory Visit (INDEPENDENT_AMBULATORY_CARE_PROVIDER_SITE_OTHER): Payer: 59 | Admitting: Physician Assistant

## 2019-02-07 VITALS — BP 128/90 | HR 72 | Temp 98.1°F | Ht 76.0 in | Wt 242.2 lb

## 2019-02-07 DIAGNOSIS — R109 Unspecified abdominal pain: Secondary | ICD-10-CM

## 2019-02-07 DIAGNOSIS — M546 Pain in thoracic spine: Secondary | ICD-10-CM

## 2019-02-07 LAB — POCT URINALYSIS DIPSTICK
Bilirubin, UA: NEGATIVE
Blood, UA: NEGATIVE
Glucose, UA: NEGATIVE
Ketones, UA: NEGATIVE
Leukocytes, UA: NEGATIVE
Nitrite, UA: NEGATIVE
Protein, UA: NEGATIVE
Spec Grav, UA: 1.01 (ref 1.010–1.025)
Urobilinogen, UA: 0.2 E.U./dL
pH, UA: 7 (ref 5.0–8.0)

## 2019-02-07 MED ORDER — CYCLOBENZAPRINE HCL 10 MG PO TABS: 10.0000 mg | ORAL_TABLET | Freq: Three times a day (TID) | ORAL | 0 refills | Status: DC | PRN

## 2019-02-07 MED ORDER — MELOXICAM 15 MG PO TABS: 15.0000 mg | ORAL_TABLET | Freq: Every day | ORAL | 0 refills | Status: DC

## 2019-02-07 NOTE — Patient Instructions (Signed)
It was great to see you!  We will be in touch with your lab results.   Instead of ibuprofen, I have prescribed daily mobic for you. This is an anti-inflammatory.  You may also use the flexeril (muscle relaxer) as needed to see if this helps your symptoms.  If labs are normal, and symptoms do not improve, we will proceed with imaging. Let me know in about a week, sooner if new symptoms.  Take care,  Inda Coke PA-C

## 2019-02-07 NOTE — Progress Notes (Signed)
Ross Ryan is a 38 y.o. male here for a new problem.  I acted as a Neurosurgeonscribe for [employer] East CorporationSamantha Stacey Maura, PA-C Corky Mullonna Orphanos, LPN  History of Present Illness:   Chief Complaint  Patient presents with  . Flank Pain    HPI   Flank pain Pt c/o bilateral low back pain x 1 week. Pt has increased his water intake without . Denies fever or chills, n/v. He has tried Advil 400 mg as needed with relief. Denies dysuria, hematuria, hx of kidney stones. He has normal appetite. Does a lot of driving. Denies back injury, saddle anesthesia, incontinence.  Wt Readings from Last 3 Encounters:  02/07/19 242 lb 4 oz (109.9 kg)  01/10/19 239 lb 6.1 oz (108.6 kg)  11/02/18 230 lb (104.3 kg)     Past Medical History:  Diagnosis Date  . Hypertension 12/2016     Social History   Socioeconomic History  . Marital status: Single    Spouse name: Not on file  . Number of children: Not on file  . Years of education: Not on file  . Highest education level: Not on file  Occupational History  . Not on file  Social Needs  . Financial resource strain: Not on file  . Food insecurity    Worry: Not on file    Inability: Not on file  . Transportation needs    Medical: Not on file    Non-medical: Not on file  Tobacco Use  . Smoking status: Former Smoker    Types: Cigars    Quit date: 07/05/2018    Years since quitting: 0.5  . Smokeless tobacco: Never Used  . Tobacco comment: smokes cigars socially- encouraged cessation  Substance and Sexual Activity  . Alcohol use: Yes    Comment: social  . Drug use: Not Currently    Types: Marijuana    Comment: quit smoking marijuana 2020 january  . Sexual activity: Yes    Birth control/protection: Condom  Lifestyle  . Physical activity    Days per week: Not on file    Minutes per session: Not on file  . Stress: Not on file  Relationships  . Social Musicianconnections    Talks on phone: Not on file    Gets together: Not on file    Attends religious service: Not on  file    Active member of club or organization: Not on file    Attends meetings of clubs or organizations: Not on file    Relationship status: Not on file  . Intimate partner violence    Fear of current or ex partner: Not on file    Emotionally abused: Not on file    Physically abused: Not on file    Forced sexual activity: Not on file  Other Topics Concern  . Not on file  Social History Narrative  . Not on file    Past Surgical History:  Procedure Laterality Date  . Fatty tumor removed from right Posterior forearm Right 2010  . KNEE SURGERY      Family History  Problem Relation Age of Onset  . Hypertension Mother   . Diabetes Father   . Hyperlipidemia Father   . Hypertension Father   . Healthy Sister   . Healthy Brother   . Dementia Maternal Grandmother   . Other Maternal Grandfather        doesnt know cause of death  . Heart disease Paternal Grandmother   . Other Paternal Grandfather  doesnt know cause of death    No Known Allergies  Current Medications:   Current Outpatient Medications:  .  amLODipine (NORVASC) 5 MG tablet, Take 1 tablet (5 mg total) by mouth daily., Disp: 90 tablet, Rfl: 3 .  clobetasol ointment (TEMOVATE) 0.05 %, Use once daily only on affected areas. Do not use on normal skin., Disp: , Rfl:  .  FLUoxetine (PROZAC) 20 MG capsule, TAKE 1 CAPSULE BY MOUTH EVERY DAY IN THE MORNING, Disp: , Rfl:  .  ibuprofen (ADVIL,MOTRIN) 600 MG tablet, Take 1 tablet (600 mg total) by mouth every 6 (six) hours as needed., Disp: 30 tablet, Rfl: 0 .  omeprazole (PRILOSEC) 40 MG capsule, TAKE 1 CAPSULE (40 MG TOTAL) BY MOUTH ONCE DAILY BEFORE BREAKFAST, Disp: 90 capsule, Rfl: 3 .  vitamin B-12 (CYANOCOBALAMIN) 250 MCG tablet, Take 250 mcg by mouth daily., Disp: , Rfl:  .  cyclobenzaprine (FLEXERIL) 10 MG tablet, Take 1 tablet (10 mg total) by mouth 3 (three) times daily as needed for muscle spasms., Disp: 30 tablet, Rfl: 0 .  meloxicam (MOBIC) 15 MG tablet, Take  1 tablet (15 mg total) by mouth daily., Disp: 30 tablet, Rfl: 0   Review of Systems:   ROS Negative unless otherwise specified per HPI.  Vitals:   Vitals:   02/07/19 1516  BP: 128/90  Pulse: 72  Temp: 98.1 F (36.7 C)  TempSrc: Temporal  SpO2: 98%  Weight: 242 lb 4 oz (109.9 kg)  Height: 6\' 4"  (1.93 m)     Body mass index is 29.49 kg/m.  Physical Exam:   Physical Exam Vitals signs and nursing note reviewed.  Constitutional:      General: He is not in acute distress.    Appearance: He is well-developed. He is not ill-appearing or toxic-appearing.  Cardiovascular:     Rate and Rhythm: Normal rate and regular rhythm.     Pulses: Normal pulses.     Heart sounds: Normal heart sounds, S1 normal and S2 normal.     Comments: No LE edema Pulmonary:     Effort: Pulmonary effort is normal.     Breath sounds: Normal breath sounds.  Abdominal:     General: Abdomen is flat. Bowel sounds are normal.     Palpations: Abdomen is soft.     Tenderness: There is no right CVA tenderness.  Musculoskeletal:     Comments: No decreased ROM 2/2 pain with extension, lateral side bends, or rotation. Does have decreased ROM 2/2 pain with forward flexion. Reproducible tenderness with deep palpation to bilateral lumbar paraspinal muscles. No bony tenderness. No evidence of erythema, rash or ecchymosis. Negative STLR bilaterally.   Skin:    General: Skin is warm and dry.  Neurological:     Mental Status: He is alert.     GCS: GCS eye subscore is 4. GCS verbal subscore is 5. GCS motor subscore is 6.  Psychiatric:        Speech: Speech normal.        Behavior: Behavior normal. Behavior is cooperative.     Results for orders placed or performed in visit on 02/07/19  POCT urinalysis dipstick  Result Value Ref Range   Color, UA yellow    Clarity, UA clear    Glucose, UA Negative Negative   Bilirubin, UA Negative    Ketones, UA Negative    Spec Grav, UA 1.010 1.010 - 1.025   Blood, UA  Negative    pH, UA 7.0 5.0 -  8.0   Protein, UA Negative Negative   Urobilinogen, UA 0.2 0.2 or 1.0 E.U./dL   Nitrite, UA Negative    Leukocytes, UA Negative Negative   Appearance Clear    Odor      Assessment and Plan:   Ross Ryan was seen today for flank pain.  Diagnoses and all orders for this visit:  Acute bilateral thoracic back pain; Elevated glucose  No red flags on exam.  Urine is normal.  Urine culture pending.  We will also update labs.  I do think he has a muscle strain, exercises provided.  I also suggested that we start daily Mobic, this will replace ibuprofen and may also take Flexeril as needed.  Sleepy precautions advised.  I recommended that if symptoms do not improve in a week or if they get worse, to let us know we will possibly proceed with imaging.  Patient verbalized understanding to plan. -     POCT urinalysis dipstick -     Urine Culture -     Comprehensive metabolic panel -     CBC with Differential/Platelet -     Hemoglobin A1c  Other orders -     cyclobenzaprine (FLEXERIL) 10 MG tablet; Take 1 tablet (10 mg total) by mouth 3 (three) times daily as needed for muscle spasms. -     meloxicam (MOBIC) 15 MG tablet; Take 1 tablet (15 mg total) by mouth daily.  . Reviewed expectations re: course of current medical issues. . Discussed self-management of symptoms. . Outlined signs and symptoms indicating need for more acute intervention. . Patient verbalized understanding and all questions were answered. . See orders for this visit as documented in the electronic medical record. . Patient received an After-Visit Summary.  CMA or LPN served as scribe during this visit. History, Physical, and Plan performed by medical provider. The above documentation has been reviewed and is accurate and complete.  Jarold Motto, PA-C

## 2019-02-08 LAB — CBC WITH DIFFERENTIAL/PLATELET
Basophils Absolute: 0 10*3/uL (ref 0.0–0.1)
Basophils Relative: 0.4 % (ref 0.0–3.0)
Eosinophils Absolute: 0.1 10*3/uL (ref 0.0–0.7)
Eosinophils Relative: 1.5 % (ref 0.0–5.0)
HCT: 41.1 % (ref 39.0–52.0)
Hemoglobin: 13.1 g/dL (ref 13.0–17.0)
Lymphocytes Relative: 39 % (ref 12.0–46.0)
Lymphs Abs: 2 10*3/uL (ref 0.7–4.0)
MCHC: 31.8 g/dL (ref 30.0–36.0)
MCV: 82.2 fl (ref 78.0–100.0)
Monocytes Absolute: 0.7 10*3/uL (ref 0.1–1.0)
Monocytes Relative: 13 % — ABNORMAL HIGH (ref 3.0–12.0)
Neutro Abs: 2.4 10*3/uL (ref 1.4–7.7)
Neutrophils Relative %: 46.1 % (ref 43.0–77.0)
Platelets: 195 10*3/uL (ref 150.0–400.0)
RBC: 5 Mil/uL (ref 4.22–5.81)
RDW: 13.9 % (ref 11.5–15.5)
WBC: 5.2 10*3/uL (ref 4.0–10.5)

## 2019-02-08 LAB — COMPREHENSIVE METABOLIC PANEL
ALT: 33 U/L (ref 0–53)
AST: 21 U/L (ref 0–37)
Albumin: 4.2 g/dL (ref 3.5–5.2)
Alkaline Phosphatase: 53 U/L (ref 39–117)
BUN: 13 mg/dL (ref 6–23)
CO2: 30 mEq/L (ref 19–32)
Calcium: 9.7 mg/dL (ref 8.4–10.5)
Chloride: 105 mEq/L (ref 96–112)
Creatinine, Ser: 1.29 mg/dL (ref 0.40–1.50)
GFR: 76.17 mL/min (ref >60.00)
Glucose, Bld: 77 mg/dL (ref 70–99)
Potassium: 4 mEq/L (ref 3.5–5.1)
Sodium: 141 mEq/L (ref 135–145)
Total Bilirubin: 0.5 mg/dL (ref 0.2–1.2)
Total Protein: 7.1 g/dL (ref 6.0–8.3)

## 2019-02-08 LAB — HEMOGLOBIN A1C: Hgb A1c MFr Bld: 6.4 % (ref 4.6–6.5)

## 2019-02-09 LAB — URINE CULTURE
MICRO NUMBER:: 877727
Result:: NO GROWTH
SPECIMEN QUALITY:: ADEQUATE

## 2019-02-14 ENCOUNTER — Other Ambulatory Visit: Payer: Self-pay

## 2019-02-14 DIAGNOSIS — Z20822 Contact with and (suspected) exposure to covid-19: Secondary | ICD-10-CM

## 2019-02-15 LAB — NOVEL CORONAVIRUS, NAA: SARS-CoV-2, NAA: NOT DETECTED

## 2019-03-04 ENCOUNTER — Other Ambulatory Visit: Payer: Self-pay

## 2019-03-04 DIAGNOSIS — Z20822 Contact with and (suspected) exposure to covid-19: Secondary | ICD-10-CM

## 2019-03-05 LAB — NOVEL CORONAVIRUS, NAA: SARS-CoV-2, NAA: NOT DETECTED

## 2019-03-10 ENCOUNTER — Other Ambulatory Visit: Payer: Self-pay

## 2019-03-10 DIAGNOSIS — Z20822 Contact with and (suspected) exposure to covid-19: Secondary | ICD-10-CM

## 2019-03-11 ENCOUNTER — Other Ambulatory Visit: Payer: Self-pay | Admitting: Physician Assistant

## 2019-03-11 LAB — NOVEL CORONAVIRUS, NAA: SARS-CoV-2, NAA: NOT DETECTED

## 2019-03-25 ENCOUNTER — Other Ambulatory Visit: Payer: Self-pay

## 2019-03-25 DIAGNOSIS — Z20822 Contact with and (suspected) exposure to covid-19: Secondary | ICD-10-CM

## 2019-03-26 LAB — NOVEL CORONAVIRUS, NAA: SARS-CoV-2, NAA: NOT DETECTED

## 2019-03-31 ENCOUNTER — Other Ambulatory Visit: Payer: Self-pay

## 2019-03-31 DIAGNOSIS — Z20822 Contact with and (suspected) exposure to covid-19: Secondary | ICD-10-CM

## 2019-04-02 LAB — NOVEL CORONAVIRUS, NAA: SARS-CoV-2, NAA: NOT DETECTED

## 2019-04-04 ENCOUNTER — Other Ambulatory Visit: Payer: Self-pay

## 2019-04-04 DIAGNOSIS — Z20822 Contact with and (suspected) exposure to covid-19: Secondary | ICD-10-CM

## 2019-04-06 LAB — NOVEL CORONAVIRUS, NAA: SARS-CoV-2, NAA: NOT DETECTED

## 2019-04-11 ENCOUNTER — Other Ambulatory Visit: Payer: Self-pay

## 2019-04-11 DIAGNOSIS — Z20822 Contact with and (suspected) exposure to covid-19: Secondary | ICD-10-CM

## 2019-04-13 LAB — NOVEL CORONAVIRUS, NAA: SARS-CoV-2, NAA: NOT DETECTED

## 2019-04-18 ENCOUNTER — Other Ambulatory Visit: Payer: Self-pay

## 2019-04-18 DIAGNOSIS — Z20822 Contact with and (suspected) exposure to covid-19: Secondary | ICD-10-CM

## 2019-04-20 LAB — NOVEL CORONAVIRUS, NAA: SARS-CoV-2, NAA: NOT DETECTED

## 2019-05-06 ENCOUNTER — Other Ambulatory Visit: Payer: Self-pay

## 2019-05-06 DIAGNOSIS — Z20822 Contact with and (suspected) exposure to covid-19: Secondary | ICD-10-CM

## 2019-05-08 LAB — NOVEL CORONAVIRUS, NAA: SARS-CoV-2, NAA: NOT DETECTED

## 2019-05-18 ENCOUNTER — Ambulatory Visit: Payer: Managed Care, Other (non HMO) | Attending: Internal Medicine

## 2019-05-18 DIAGNOSIS — R238 Other skin changes: Secondary | ICD-10-CM

## 2019-05-18 DIAGNOSIS — U071 COVID-19: Secondary | ICD-10-CM

## 2019-05-19 LAB — NOVEL CORONAVIRUS, NAA: SARS-CoV-2, NAA: NOT DETECTED

## 2019-05-25 ENCOUNTER — Encounter

## 2019-05-30 ENCOUNTER — Ambulatory Visit: Payer: Managed Care, Other (non HMO) | Attending: Internal Medicine

## 2019-05-30 DIAGNOSIS — Z20822 Contact with and (suspected) exposure to covid-19: Secondary | ICD-10-CM

## 2019-05-31 LAB — NOVEL CORONAVIRUS, NAA: SARS-CoV-2, NAA: NOT DETECTED

## 2019-06-09 ENCOUNTER — Ambulatory Visit: Payer: Self-pay | Attending: Internal Medicine

## 2019-06-09 DIAGNOSIS — Z20822 Contact with and (suspected) exposure to covid-19: Secondary | ICD-10-CM

## 2019-06-11 LAB — NOVEL CORONAVIRUS, NAA: SARS-CoV-2, NAA: NOT DETECTED

## 2019-06-15 ENCOUNTER — Other Ambulatory Visit: Payer: Self-pay

## 2019-06-16 ENCOUNTER — Other Ambulatory Visit (HOSPITAL_COMMUNITY)
Admission: RE | Admit: 2019-06-16 | Discharge: 2019-06-16 | Disposition: A | Discharge status: Home / Self Care | Payer: Self-pay | Source: Ambulatory Visit | Attending: Family Medicine | Admitting: Family Medicine

## 2019-06-16 ENCOUNTER — Ambulatory Visit (INDEPENDENT_AMBULATORY_CARE_PROVIDER_SITE_OTHER): Payer: Self-pay | Admitting: Family Medicine

## 2019-06-16 ENCOUNTER — Encounter: Payer: Self-pay | Admitting: Family Medicine

## 2019-06-16 VITALS — BP 126/88 | HR 59 | Temp 97.2°F | Ht 76.0 in | Wt 231.4 lb

## 2019-06-16 DIAGNOSIS — Z114 Encounter for screening for human immunodeficiency virus [HIV]: Secondary | ICD-10-CM

## 2019-06-16 DIAGNOSIS — I10 Essential (primary) hypertension: Secondary | ICD-10-CM

## 2019-06-16 DIAGNOSIS — Z118 Encounter for screening for other infectious and parasitic diseases: Secondary | ICD-10-CM | POA: Insufficient documentation

## 2019-06-16 DIAGNOSIS — Z113 Encounter for screening for infections with a predominantly sexual mode of transmission: Secondary | ICD-10-CM | POA: Insufficient documentation

## 2019-06-16 DIAGNOSIS — F3342 Major depressive disorder, recurrent, in full remission: Secondary | ICD-10-CM

## 2019-06-16 DIAGNOSIS — R739 Hyperglycemia, unspecified: Secondary | ICD-10-CM

## 2019-06-16 NOTE — Assessment & Plan Note (Signed)
Visits with Dr. Rene Kocher really helped him.  Had been on fluoxetine but was able to come off.  PHQ-9 of 0 today.  Full remission despite being on Prozac-continue to follow at least every 6 months.  Patient is to let me know if symptoms worsen-may be hard to get back into Dr. Rene Kocher of psychiatry but I would consider prescribing fluoxetine due to prior profound response

## 2019-06-16 NOTE — Progress Notes (Signed)
Phone [phone] In person visit   Subjective:   Ross Ryan is a 39 y.o. year old very pleasant male patient who presents for/with See problem oriented charting Chief Complaint  Patient presents with  . Exposure to STD    This visit occurred during the SARS-CoV-2 public health emergency.  Safety protocols were in place, including screening questions prior to the visit, additional usage of staff PPE, and extensive cleaning of exam room while observing appropriate contact time as indicated for disinfecting solutions.   Past Medical History-  Patient Active Problem List   Diagnosis Date Noted  . Gastroesophageal reflux disease without esophagitis 11/02/2018    Priority: Medium  . 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/05/2018    Priority: Medium  . Hypertension 07/05/2018    Priority: Medium  . Throat irritation 07/05/2018    Priority: Low  . Weight loss 07/05/2018    Priority: Low  . 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 Vitamin B12 deficiency - oral repletion  02/02/2017    Priority: Low  . 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 amLODipine (NORVASC) 5 MG tablet Take 1 tablet (5 mg total) by mouth daily. 90 tablet 3  . clobetasol ointment (TEMOVATE) 0.05 % Use once daily only on affected areas. Do not use on normal skin.    Marland Kitchen omeprazole (PRILOSEC) 40 MG capsule TAKE 1 CAPSULE (40 MG TOTAL) BY MOUTH ONCE DAILY BEFORE BREAKFAST 90 capsule 3  . vitamin B-12 (CYANOCOBALAMIN) 250 MCG tablet Take 250 mcg by mouth daily.     No current facility-administered medications for this visit.     Objective:  BP 126/88   Pulse (!) 59   Temp (!) 97.2 F (36.2 C) (Temporal)   Ht 6\' 4"  (1.93 m)   Wt 231 lb 6.4 oz (105 kg)   SpO2 97%   BMI 28.17 kg/m  Gen: NAD, resting comfortably CV:  RRR no murmurs rubs or gallops Lungs: CTAB no crackles, wheeze, rhonchi Ext: no edema Skin: warm, dry     Assessment and Plan  # STI testing  S:[facility]ally goes to health department [facility] year for screening. Is closed right now due to Covid not able to get there. Has had new partner but no symptoms. Last unprotected sex about a month ago. Women only. Immunized from hep b. Starting to date someone long term and wants clean bill of health getting into relationship.  A/P: Patient presents for STI screening after unprotected sex.  No reported lesions in the groin to suggest herpes or warts.  Will screen for gonorrhea/chlamydia/trichomonas as well as HIV and syphilis.  #hypertension #Hyperglycemia/prediabetes S: compliant with amlodipine 5 mg. Down 11 lbs - cut back on portion size and working out.   We reviewed last A1c of 6.4 and importance of weight loss/healthy lifestyle to prevent diabetes BP Readings from Last 3 Encounters:  06/16/19 126/88  02/07/19 128/90  01/10/19 126/80  A/P: Good control of blood pressure-continue amlodipine 5 mg  In regards to elevated diabetes risk-suggested 3 to 92-month follow-up so we can repeat A1c-he will continue to work on healthy lifestyle changes in the meantime.  If A1c remains above 6 and lifestyle changes have stagnated could consider metformin.   Major depression in full remission [facility]) Visits with [facility]. [facility] really helped him.  Had  been on fluoxetine but was able to come off.  PHQ-9 of 0 today.  Full remission despite being on Prozac-continue to follow at least every 6 months.  Patient is to let me know if symptoms worsen-may be hard to get back into Dr. 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 Essential hypertension  I10   2. Recurrent major depressive disorder, in full remission ([place])  F33.42   3. Hyperglycemia  R73.9   4. Encounter for screening for HIV  Z11.4 HIV Antibody (routine testing w rflx)  5. Screening for venereal disease  Z11.3 RPR  6. Screening for gonorrhea  Z11.3 Urine cytology ancillary only    Urine cytology ancillary only  7. Screening for chlamydial disease  Z11.8 Urine cytology ancillary only    Urine cytology ancillary only   Return precautions advised.  Garret Reddish, MD

## 2019-06-16 NOTE — Patient Instructions (Addendum)
Please stop by lab before you go If you do not have mychart- we will call you about results within 5 business days of [place] receiving them.  If you have mychart- we will send your results within 3 business days of [place] receiving them.  If abnormal or we want to clarify a result, we will call or mychart you to make sure you receive the message.  If you have questions or concerns or don't hear within 5-7 days, please send [place] a message or call us.    Recommended follow up: No follow-ups on file.

## 2019-06-17 ENCOUNTER — Ambulatory Visit: Payer: HRSA Program | Attending: Internal Medicine

## 2019-06-17 DIAGNOSIS — Z20822 Contact with and (suspected) exposure to covid-19: Secondary | ICD-10-CM | POA: Insufficient documentation

## 2019-06-17 LAB — RPR: RPR Ser Ql: NONREACTIVE

## 2019-06-17 LAB — URINE CYTOLOGY ANCILLARY ONLY
Chlamydia: NEGATIVE
Comment: NEGATIVE
Comment: NEGATIVE
Comment: NORMAL
Neisseria Gonorrhea: NEGATIVE
Trichomonas: NEGATIVE

## 2019-06-17 LAB — HIV ANTIBODY (ROUTINE TESTING W REFLEX): HIV 1&2 Ab, 4th Generation: NONREACTIVE

## 2019-06-18 LAB — NOVEL CORONAVIRUS, NAA: SARS-CoV-2, NAA: NOT DETECTED

## 2019-06-30 ENCOUNTER — Ambulatory Visit: Payer: Self-pay | Attending: Internal Medicine

## 2019-06-30 DIAGNOSIS — Z20822 Contact with and (suspected) exposure to covid-19: Secondary | ICD-10-CM | POA: Insufficient documentation

## 2019-07-01 LAB — NOVEL CORONAVIRUS, NAA: SARS-CoV-2, NAA: NOT DETECTED

## 2019-07-26 ENCOUNTER — Other Ambulatory Visit: Payer: Self-pay

## 2019-07-27 ENCOUNTER — Ambulatory Visit: Payer: Self-pay | Attending: Internal Medicine

## 2019-07-27 DIAGNOSIS — Z20822 Contact with and (suspected) exposure to covid-19: Secondary | ICD-10-CM | POA: Insufficient documentation

## 2019-07-28 LAB — NOVEL CORONAVIRUS, NAA: SARS-CoV-2, NAA: NOT DETECTED

## 2019-08-31 ENCOUNTER — Ambulatory Visit: Payer: HRSA Program | Attending: Internal Medicine

## 2019-08-31 DIAGNOSIS — Z20822 Contact with and (suspected) exposure to covid-19: Secondary | ICD-10-CM | POA: Diagnosis not present

## 2019-09-01 LAB — SARS-COV-2, NAA 2 DAY TAT

## 2019-09-01 LAB — NOVEL CORONAVIRUS, NAA: SARS-CoV-2, NAA: NOT DETECTED

## 2019-10-03 ENCOUNTER — Ambulatory Visit: Payer: HRSA Program | Attending: Internal Medicine

## 2019-10-03 DIAGNOSIS — Z20822 Contact with and (suspected) exposure to covid-19: Secondary | ICD-10-CM | POA: Insufficient documentation

## 2019-10-04 LAB — SARS-COV-2, NAA 2 DAY TAT

## 2019-10-04 LAB — NOVEL CORONAVIRUS, NAA: SARS-CoV-2, NAA: NOT DETECTED

## 2019-12-07 ENCOUNTER — Other Ambulatory Visit: Payer: Self-pay

## 2020-01-02 ENCOUNTER — Telehealth: Payer: Self-pay | Admitting: Family Medicine

## 2020-01-02 NOTE — Telephone Encounter (Signed)
Please call to make app for office visit

## 2020-01-02 NOTE — Telephone Encounter (Signed)
Patient has been scheduled

## 2020-01-02 NOTE — Telephone Encounter (Signed)
Patient requested labs:  A routine STD test,  And blood sugar testing . Please advise if he needs appointment prior if we can just schedule him after orders are put in.Thank you!

## 2020-01-03 ENCOUNTER — Encounter: Payer: Self-pay | Admitting: Family Medicine

## 2020-01-03 ENCOUNTER — Other Ambulatory Visit (HOSPITAL_COMMUNITY)
Admission: RE | Admit: 2020-01-03 | Discharge: 2020-01-03 | Disposition: A | Discharge status: Home / Self Care | Payer: Self-pay | Source: Ambulatory Visit | Attending: Family Medicine | Admitting: Family Medicine

## 2020-01-03 ENCOUNTER — Other Ambulatory Visit: Payer: Self-pay

## 2020-01-03 ENCOUNTER — Ambulatory Visit (INDEPENDENT_AMBULATORY_CARE_PROVIDER_SITE_OTHER): Payer: Self-pay | Admitting: Family Medicine

## 2020-01-03 VITALS — HR 95 | Temp 101.0°F

## 2020-01-03 DIAGNOSIS — Z113 Encounter for screening for infections with a predominantly sexual mode of transmission: Secondary | ICD-10-CM | POA: Insufficient documentation

## 2020-01-03 DIAGNOSIS — Z118 Encounter for screening for other infectious and parasitic diseases: Secondary | ICD-10-CM

## 2020-01-03 DIAGNOSIS — I10 Essential (primary) hypertension: Secondary | ICD-10-CM

## 2020-01-03 DIAGNOSIS — R739 Hyperglycemia, unspecified: Secondary | ICD-10-CM

## 2020-01-03 DIAGNOSIS — Z20822 Contact with and (suspected) exposure to covid-19: Secondary | ICD-10-CM

## 2020-01-03 DIAGNOSIS — Z114 Encounter for screening for human immunodeficiency virus [HIV]: Secondary | ICD-10-CM

## 2020-01-03 DIAGNOSIS — Z202 Contact with and (suspected) exposure to infections with a predominantly sexual mode of transmission: Secondary | ICD-10-CM | POA: Insufficient documentation

## 2020-01-03 DIAGNOSIS — R509 Fever, unspecified: Secondary | ICD-10-CM

## 2020-01-03 MED ORDER — AMLODIPINE BESYLATE 10 MG PO TABS: 10.0000 mg | ORAL_TABLET | Freq: Every evening | ORAL | 3 refills | Status: DC

## 2020-01-03 NOTE — Patient Instructions (Signed)
Health Maintenance Due  Topic Date Due  . Hepatitis C Screening  Never done  . COVID-19 Vaccine (1) Never done   Depression screen [facility] 2/9 06/16/2019 11/02/2018 07/05/2018  Decreased Interest 0 0 2  Down, Depressed, Hopeless 0 0 2  PHQ - 2 Score 0 0 4  Altered sleeping 0 0 3  Tired, decreased energy 0 0 3  Change in appetite 0 0 3  Feeling bad or failure about yourself  0 0 2  Trouble concentrating 0 0 2  Moving slowly or fidgety/restless 0 1 1  Suicidal thoughts 0 0 1  PHQ-9 Score 0 1 19  Difficult doing work/chores Not difficult at all Not difficult at all -

## 2020-01-03 NOTE — Progress Notes (Signed)
Phone [phone] Virtual visit via Video note   Subjective:  Chief complaint: Chief Complaint  Patient presents with  . Follow-up  . Hypertension    This visit type was conducted due to national recommendations for restrictions regarding the COVID-19 Pandemic (e.g. social distancing).  This format is felt to be most appropriate for this patient at this time balancing risks to patient and risks to population by having him in for in person visit.  No physical exam was performed (except for noted visual exam or audio findings with Telehealth visits).    Our team/I connected with Ross Ryan at  3:00 PM EDT by a video enabled telemedicine application (doxy.me or caregility through epic) and verified that I am speaking with the correct person using two identifiers.  Location patient: Home[facility] Location provider: [facility], office Persons participating in the virtual visit:  patient  Our team/I discussed the limitations of evaluation and management by telemedicine and the availability of in person appointments. In light of current covid-19 pandemic, patient also understands that we are trying to protect them by minimizing in office contact if at all possible.  The patient expressed consent for telemedicine visit and agreed to proceed. Patient understands insurance will be billed.   Past Medical History-  Patient Active Problem List   Diagnosis Date Noted  . Gastroesophageal reflux disease without esophagitis 11/02/2018    Priority: Medium  . 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/05/2018    Priority: Medium  . Hypertension 07/05/2018    Priority: Medium  . Throat irritation 07/05/2018    Priority: Low  . Weight loss 07/05/2018    Priority: Low  . 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 Vitamin B12 deficiency - oral repletion  02/02/2017    Priority: Low  . 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 amLODipine (NORVASC) 10 MG tablet Take 1 tablet (10 mg total) by mouth at bedtime. 90 tablet 3  . omeprazole (PRILOSEC) 40 MG capsule TAKE 1 CAPSULE (40 MG TOTAL) BY MOUTH ONCE DAILY BEFORE BREAKFAST 90 capsule 3   No current facility-administered medications for this visit.     Objective:  Pulse 95   Temp (!) 101 F (38.3 C)   SpO2 98%  self reported vitals Gen: NAD, resting comfortably in chair.  Wearing mask Lungs: nonlabored, normal respiratory rate  Skin: appears dry, no obvious rash     Assessment and Plan   #Please note patient presented for visit and was already a part of the rooming process-urine has been obtained and then nursing staff discovered patient had a fever and that he had been tested for COVID-19 recently-nursing staff and patient wore a mask at all times and interaction was approximately 5 minutes.  There was some misinterpreted communication at the front desk with initial screening so patient was not adequately screened.  When I entered the room I was wearing n95 mask, gown, gloves.  I checked patient's pulse ox and listened to his lungs.  I explained the situation and explored with him out-we finished the visit virtually and plan to have him back for labs after we can either rule out Covid or he has finished his quarantine  #STD testing S: pt would like STD testing, he does use condoms so he is not having any symptoms.  HIV  and syphilis testing requested- he already dropped off a urine A/P: We will screen for STDs-he did drop off a urine so can go ahead and do gonorrhea/chlamydia/trichomonas.  Ordered labs for HIV and syphilis testing  #hypertension S: medication: amlodipine 5 mg in the morning. Home readings #s: 153/96 recently as of yesterday, he is concerned that his numbers are rising. BP Readings from Last 3 Encounters:  06/16/19 126/88  02/07/19  128/90  01/10/19 126/80  A/P: Poor control on home checks-discovered after patient had already left the building.  We will increase to amlodipine 10 mg at night-switch from morning dose.  He will take an additional 5 mg tonight and then 10 mg tomorrow evening  # Hyperglycemia/insulin resistance/prediabetes S:  Medication: None Lab Results  Component Value Date   HGBA1C 6.4 02/07/2019   A/P: We discussed significantly increased prediabetes risk-recommended patient come back for hemoglobin A1c  # Possible Covid 19/fever S:started on Sunday   With itchy throat, runny nose, then congestion. Fever started by Monday and up to 101. Has persisted through the day. No loss of taste or smell. No shortness of breath other than feels uncomfortable behind the mask- seems to harder to breath with mask on but not without it. Has had chills and fatigue. Some nausea. Some cough  No shortness of breath, body aches, sore throat,  vomiting, diarrhea, or new loss of taste or smell. No known contacts with covid 19 (was in [facility] Thursday to Sunday) or someone being tested for covid 19.   He is not vaccinated from covid 19 A/P: Patient with symptoms concerning for potential covid 19 with fever and recent trip to [place] Therefore: -already tested at 2 sites ([facility] and [facility])- pending results- asked him to update me when he has results back - recommended patient watch closely for shortness of breath or confusion or worsening symptoms and if those occur he should contact us immediately  -already purchased pulse oximeter and if levels 94% or below persistently- seek care at the hospital  -recommended self isolation until negative test  at minimum . - for quarantine if covid 19 test positive would need to be at least 10 days since first symptom AND at least 24 hours fever free without fever reducing medications AND improvement in respiratory symptoms  - we also discussed close contacts would need 14 day quarantine if  unvaccinated after last close contact with patient IF patient is positive  - work note needed but we need to see if he is positive fist before determining duration- works on thursday  * LABS pending- will be completed if negative for covid and 24 hours post fever or after quarantine if covid positive  Recommended follow up: As needed for acute concerns  Lab/Order associations:   ICD-10-CM   1. Essential hypertension  I10   2. Fever, unspecified fever cause  R50.9 CBC With Differential/Platelet    COMPLETE METABOLIC PANEL WITH GFR  3. Hyperglycemia  R73.9 Hemoglobin A1c  4. Screening for gonorrhea  Z11.3 Urine cytology ancillary only([place])  5. Screening for chlamydial disease  Z11.8 Urine cytology ancillary only([place])  6. Screening for HIV (human immunodeficiency virus)  Z11.4 HIV Antibody (routine testing w rflx)  7. Screening for venereal disease  Z11.3 RPR    Meds ordered this encounter  Medications  . amLODipine (NORVASC) 10 MG tablet    Sig: Take 1 tablet (10 mg total) by mouth at bedtime.    Dispense:  90 tablet    Refill:  3    Return precautions advised.  Garret Reddish, MD

## 2020-01-04 LAB — SARS-COV-2, NAA 2 DAY TAT

## 2020-01-04 LAB — NOVEL CORONAVIRUS, NAA: SARS-CoV-2, NAA: NOT DETECTED

## 2020-01-05 ENCOUNTER — Other Ambulatory Visit: Payer: Self-pay

## 2020-01-05 DIAGNOSIS — R509 Fever, unspecified: Secondary | ICD-10-CM

## 2020-01-05 DIAGNOSIS — Z113 Encounter for screening for infections with a predominantly sexual mode of transmission: Secondary | ICD-10-CM

## 2020-01-05 DIAGNOSIS — Z114 Encounter for screening for human immunodeficiency virus [HIV]: Secondary | ICD-10-CM

## 2020-01-05 DIAGNOSIS — R739 Hyperglycemia, unspecified: Secondary | ICD-10-CM

## 2020-01-05 LAB — URINE CYTOLOGY ANCILLARY ONLY
Chlamydia: NEGATIVE
Comment: NEGATIVE
Comment: NEGATIVE
Comment: NORMAL
Neisseria Gonorrhea: NEGATIVE
Trichomonas: NEGATIVE

## 2020-01-06 LAB — CBC WITH DIFFERENTIAL/PLATELET
Absolute Monocytes: 659 cells/uL (ref 200–950)
Basophils Absolute: 29 cells/uL (ref 0–200)
Basophils Relative: 0.9 %
Eosinophils Absolute: 90 cells/uL (ref 15–500)
Eosinophils Relative: 2.8 %
HCT: 44.4 % (ref 38.5–50.0)
Hemoglobin: 14 g/dL (ref 13.2–17.1)
Lymphs Abs: 1258 cells/uL (ref 850–3900)
MCH: 25.7 pg — ABNORMAL LOW (ref 27.0–33.0)
MCHC: 31.5 g/dL — ABNORMAL LOW (ref 32.0–36.0)
MCV: 81.6 fL (ref 80.0–100.0)
MPV: 9.3 fL (ref 7.5–12.5)
Monocytes Relative: 20.6 %
Neutro Abs: 1165 cells/uL — ABNORMAL LOW (ref 1500–7800)
Neutrophils Relative %: 36.4 %
Platelets: 210 10*3/uL (ref 140–400)
RBC: 5.44 10*6/uL (ref 4.20–5.80)
RDW: 12.4 % (ref 11.0–15.0)
Total Lymphocyte: 39.3 %
WBC: 3.2 10*3/uL — ABNORMAL LOW (ref 3.8–10.8)

## 2020-01-06 LAB — COMPLETE METABOLIC PANEL WITH GFR
AG Ratio: 1.4 (calc) (ref 1.0–2.5)
ALT: 47 U/L — ABNORMAL HIGH (ref 9–46)
AST: 31 U/L (ref 10–40)
Albumin: 4.4 g/dL (ref 3.6–5.1)
Alkaline phosphatase (APISO): 60 U/L (ref 36–130)
BUN: 11 mg/dL (ref 7–25)
CO2: 28 mmol/L (ref 20–32)
Calcium: 9.5 mg/dL (ref 8.6–10.3)
Chloride: 105 mmol/L (ref 98–110)
Creat: 1.31 mg/dL (ref 0.60–1.35)
GFR, Est African American: 80 mL/min/{1.73_m2} (ref >=60)
GFR, Est Non African American: 69 mL/min/{1.73_m2} (ref >=60)
Globulin: 3.1 g/dL (calc) (ref 1.9–3.7)
Glucose, Bld: 119 mg/dL — ABNORMAL HIGH (ref 65–99)
Potassium: 4.2 mmol/L (ref 3.5–5.3)
Sodium: 140 mmol/L (ref 135–146)
Total Bilirubin: 0.4 mg/dL (ref 0.2–1.2)
Total Protein: 7.5 g/dL (ref 6.1–8.1)

## 2020-01-06 LAB — HEMOGLOBIN A1C
Hgb A1c MFr Bld: 4.7 % of total Hgb (ref <5.7)
Mean Plasma Glucose: 88 (calc)
eAG (mmol/L): 4.9 (calc)

## 2020-01-06 LAB — RPR: RPR Ser Ql: NONREACTIVE

## 2020-01-06 LAB — HIV ANTIBODY (ROUTINE TESTING W REFLEX): HIV 1&2 Ab, 4th Generation: NONREACTIVE

## 2020-01-11 ENCOUNTER — Other Ambulatory Visit: Payer: Self-pay | Admitting: Critical Care Medicine

## 2020-01-11 ENCOUNTER — Other Ambulatory Visit: Payer: Self-pay

## 2020-01-11 DIAGNOSIS — Z20822 Contact with and (suspected) exposure to covid-19: Secondary | ICD-10-CM

## 2020-01-12 LAB — SARS-COV-2, NAA 2 DAY TAT

## 2020-01-12 LAB — NOVEL CORONAVIRUS, NAA: SARS-CoV-2, NAA: NOT DETECTED

## 2020-03-22 ENCOUNTER — Other Ambulatory Visit: Payer: Self-pay

## 2020-03-22 DIAGNOSIS — Z20822 Contact with and (suspected) exposure to covid-19: Secondary | ICD-10-CM

## 2020-03-23 LAB — SARS-COV-2, NAA 2 DAY TAT

## 2020-03-23 LAB — NOVEL CORONAVIRUS, NAA: SARS-CoV-2, NAA: NOT DETECTED

## 2020-04-16 ENCOUNTER — Other Ambulatory Visit: Payer: Self-pay

## 2020-04-16 ENCOUNTER — Encounter: Payer: Self-pay | Admitting: Family Medicine

## 2020-04-16 MED ORDER — OMEPRAZOLE 40 MG PO CPDR: DELAYED_RELEASE_CAPSULE | ORAL | 3 refills | Status: DC

## 2020-05-24 ENCOUNTER — Other Ambulatory Visit: Payer: Self-pay

## 2020-05-26 HISTORY — PX: OTHER SURGICAL HISTORY: SHX169

## 2020-05-30 ENCOUNTER — Other Ambulatory Visit: Payer: Self-pay

## 2020-05-30 DIAGNOSIS — Z20822 Contact with and (suspected) exposure to covid-19: Secondary | ICD-10-CM

## 2020-05-31 LAB — SARS-COV-2, NAA 2 DAY TAT

## 2020-05-31 LAB — NOVEL CORONAVIRUS, NAA: SARS-CoV-2, NAA: NOT DETECTED

## 2020-08-30 ENCOUNTER — Other Ambulatory Visit: Payer: Self-pay

## 2020-08-30 ENCOUNTER — Emergency Department (HOSPITAL_BASED_OUTPATIENT_CLINIC_OR_DEPARTMENT_OTHER)
Admission: EM | Admit: 2020-08-30 | Discharge: 2020-08-30 | Disposition: A | Discharge status: Home / Self Care | Payer: Self-pay | Attending: Emergency Medicine | Admitting: Emergency Medicine

## 2020-08-30 ENCOUNTER — Encounter (HOSPITAL_BASED_OUTPATIENT_CLINIC_OR_DEPARTMENT_OTHER): Payer: Self-pay

## 2020-08-30 DIAGNOSIS — Z87891 Personal history of nicotine dependence: Secondary | ICD-10-CM | POA: Insufficient documentation

## 2020-08-30 DIAGNOSIS — U071 COVID-19: Secondary | ICD-10-CM | POA: Insufficient documentation

## 2020-08-30 DIAGNOSIS — J111 Influenza due to unidentified influenza virus with other respiratory manifestations: Secondary | ICD-10-CM

## 2020-08-30 DIAGNOSIS — I10 Essential (primary) hypertension: Secondary | ICD-10-CM | POA: Insufficient documentation

## 2020-08-30 DIAGNOSIS — Z79899 Other long term (current) drug therapy: Secondary | ICD-10-CM | POA: Insufficient documentation

## 2020-08-30 LAB — INFLUENZA PANEL BY PCR (TYPE A & B)
Influenza A By PCR: NEGATIVE
Influenza B By PCR: NEGATIVE

## 2020-08-30 MED ORDER — ACETAMINOPHEN 500 MG PO TABS
1000.0000 mg | ORAL_TABLET | Freq: Once | ORAL | Status: AC
  Administered 2020-08-30: 1000 mg via ORAL
  Filled 2020-08-30: qty 2

## 2020-08-30 MED ORDER — OSELTAMIVIR PHOSPHATE 75 MG PO CAPS: 75.0000 mg | ORAL_CAPSULE | Freq: Two times a day (BID) | ORAL | 0 refills | Status: DC

## 2020-08-30 NOTE — ED Notes (Signed)
Pt verbalizes understanding of discharge instructions. Opportunity for questioning and answers were provided. Armand removed by staff, pt discharged from ED ambulatory to Home.   

## 2020-08-30 NOTE — ED Triage Notes (Addendum)
Pt states woke up  This morning having chills and his both  Hands are cold the whole day -  His temp at home was 102.7  And also  C/o  lightheaded  Denies cough /SOB   Had advil  at 1700H

## 2020-08-30 NOTE — ED Provider Notes (Signed)
[facility] EMERGENCY DEPT Provider Note   CSN: [id number] Arrival date & time: 08/30/20  1954     History Chief Complaint  Patient presents with  . Fever  . Dizziness    Ross Ryan is a 40 y.o. male.  The history is provided by the patient.  Fever Max temp prior to arrival:  102.9 Temp source:  Oral Severity:  Moderate Onset quality:  Sudden Duration:  1 day Timing:  Constant Progression:  Waxing and waning Chronicity:  New Relieved by:  Nothing Worsened by:  Nothing Ineffective treatments:  None tried Associated symptoms: chills and myalgias   Associated symptoms: no chest pain, no congestion, no cough, no diarrhea, no dysuria, no ear pain, no nausea, no rash, no sore throat and no vomiting   Risk factors comment:  History of hypertension.  No known sick contacts Dizziness Associated symptoms: no chest pain, no diarrhea, no nausea and no vomiting        Past Medical History:  Diagnosis Date  . Hypertension 12/2016    Patient Active Problem List   Diagnosis Date Noted  . Gastroesophageal reflux disease without esophagitis 11/02/2018  . 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/05/2018  . Hypertension 07/05/2018  . Throat irritation 07/05/2018  . Weight loss 07/05/2018  . 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[facility]  04/02/2017  . Vitamin B12 deficiency - oral repletion  02/02/2017  . 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 Fatty tumor removed from right Posterior forearm Right 2010  . KNEE SURGERY         Family History  Problem Relation Age of Onset  . Hypertension Mother   . Diabetes Father   . Hyperlipidemia Father   . Hypertension Father   . Healthy Sister   . Healthy Brother   . Dementia Maternal Grandmother   . Other Maternal Grandfather        doesnt know cause of death  . Heart disease Paternal Grandmother   . Other Paternal  Grandfather        doesnt know cause of death    Social History   Tobacco Use  . Smoking status: Former Smoker    Types: Cigars    Quit date: 07/05/2018    Years since quitting: 2.1  . Smokeless tobacco: Never Used  . Tobacco comment: smokes cigars socially- encouraged cessation  Vaping Use  . Vaping Use: Never used  Substance Use Topics  . Alcohol use: Yes    Comment: social  . Drug use: Not Currently    Types: Marijuana    Comment: quit smoking marijuana 2020 january    Home Medications Prior to Admission medications   Medication Sig Start Date End Date Taking? Authorizing Provider  amLODipine (NORVASC) 10 MG tablet Take 1 tablet (10 mg total) by mouth at bedtime. 01/03/20   Shelva Majestic, MD  omeprazole (PRILOSEC) 40 MG capsule TAKE 1 CAPSULE (40 MG TOTAL) BY MOUTH ONCE DAILY BEFORE BREAKFAST 04/16/20   Shelva Majestic, MD    Allergies    Patient has no known allergies.  Review of Systems   Review of Systems  Constitutional: Positive for chills and fever.  HENT: Negative for congestion, ear pain and sore throat.   Respiratory: Negative for cough.   Cardiovascular: Negative for chest pain.  Gastrointestinal: Negative for diarrhea, nausea and vomiting.  Genitourinary: Negative for dysuria.  Musculoskeletal: Positive for myalgias.  Skin:  Negative for rash.  Neurological: Positive for dizziness.  All other systems reviewed and are negative.   Physical Exam Updated Vital Signs BP (!) 150/79 (BP Location: Right Arm)   Pulse (!) 119   Temp (!) 102.9 F (39.4 C) (Oral)   Resp 20   Ht 6\' 4"  (1.93 m)   Wt 106.6 kg   SpO2 100%   BMI 28.61 kg/m   Physical Exam Vitals and nursing note reviewed.  Constitutional:      General: He is not in acute distress.    Appearance: He is well-developed.  HENT:     Head: Normocephalic and atraumatic.     Right Ear: Tympanic membrane normal.     Left Ear: Tympanic membrane normal.  Eyes:     Conjunctiva/sclera:  Conjunctivae normal.     Pupils: Pupils are equal, round, and reactive to light.  Cardiovascular:     Rate and Rhythm: Regular rhythm. Tachycardia present.     Heart sounds: No murmur heard.   Pulmonary:     Effort: Pulmonary effort is normal. No respiratory distress.     Breath sounds: Normal breath sounds. No wheezing or rales.  Abdominal:     General: There is no distension.     Palpations: Abdomen is soft.     Tenderness: There is no abdominal tenderness. There is no guarding or rebound.  Musculoskeletal:        General: No tenderness. Normal range of motion.     Cervical back: Normal range of motion and neck supple.     Right lower leg: No edema.     Left lower leg: No edema.  Skin:    General: Skin is warm and dry.     Findings: No erythema or rash.  Neurological:     Mental Status: He is alert and oriented to person, place, and time. Mental status is at baseline.  Psychiatric:        Mood and Affect: Mood normal.        Behavior: Behavior normal.        Thought Content: Thought content normal.     ED Results / Procedures / Treatments   Labs (all labs ordered are listed, but only abnormal results are displayed) Labs Reviewed  SARS CORONAVIRUS 2 (TAT 6-24 HRS)  INFLUENZA PANEL BY PCR (TYPE A & B)    EKG None  Radiology No results found.  Procedures Procedures   Medications Ordered in ED Medications  acetaminophen (TYLENOL) tablet 1,000 mg (1,000 mg Oral Given 08/30/20 2059)    ED Course  I have reviewed the triage vital signs and the nursing notes.  Pertinent labs & imaging results that were available during my care of the patient were reviewed by me and considered in my medical decision making (see chart for details).    MDM Rules/Calculators/A&P                          Pt with symptoms consistent with influenza like illness.  Normal exam here but is febrile and tachy most likely due to fever.  No signs of breathing difficulty  No signs of strep  pharyngitis, otitis or abnormal abdominal findings.   Pt given fever control.  Flu and covid pending.  Will continue antipyretica and rest and fluids and return for any further problems. Patient given a Tamiflu prescription to use if his flu test is positive.  Repeat temperature was 99.5.  Repeat heart rate was  100.  Final Clinical Impression(s) / ED Diagnoses Final diagnoses:  Influenza-like illness    Rx / DC Orders ED Discharge Orders         Ordered    oseltamivir (TAMIFLU) 75 MG capsule  Every 12 hours        08/30/20 2144           Gwyneth Sprout, MD 08/30/20 2145

## 2020-08-30 NOTE — Discharge Instructions (Signed)
You were tested for both the flu and Covid.  Results should be back by the morning.  It is important that you drink plenty of fluids, get lots of rest you can use Tylenol or ibuprofen to help with body aches and fever control.  If your flu test is positive you can start the Tamiflu.

## 2020-08-31 LAB — SARS CORONAVIRUS 2 (TAT 6-24 HRS): SARS Coronavirus 2: POSITIVE — AB

## 2020-09-01 ENCOUNTER — Telehealth: Payer: Self-pay | Admitting: Physician Assistant

## 2020-09-01 NOTE — Telephone Encounter (Signed)
Called to discuss with patient about COVID-19 symptoms and the use of one of the available treatments for those with mild to moderate Covid symptoms and at a high risk of hospitalization.  Pt appears to qualify for outpatient treatment due to co-morbid conditions and/or a member of an at-risk group in accordance with the FDA Emergency Use Authorization.    Symptom onset: 4/6 per ER notes.  Vaccinated: unknown Booster? unknown Immunocompromised? no Qualifiers: HTN, BMI, high SVI  Unable to reach pt - left VM and my chart message.    Cline Crock

## 2020-09-02 ENCOUNTER — Telehealth: Payer: Self-pay | Admitting: Unknown Physician Specialty

## 2020-09-02 ENCOUNTER — Encounter: Payer: Self-pay | Admitting: Family Medicine

## 2020-09-02 MED ORDER — MOLNUPIRAVIR EUA 200MG CAPSULE: 4.0000 | ORAL_CAPSULE | Freq: Two times a day (BID) | ORAL | 0 refills | Status: DC

## 2020-09-02 NOTE — Telephone Encounter (Signed)
Outpatient Oral COVID Treatment Note  I connected with Ross Ryan on 09/02/2020/9:43 AM by telephone and verified that I am speaking with the correct person using two identifiers.  I discussed the limitations, risks, security, and privacy concerns of performing an evaluation and management service by telephone and the availability of in person appointments. I also discussed with the patient that there may be a patient responsible charge related to this service. The patient expressed understanding and agreed to proceed.  Patient location: home Provider location: home  Diagnosis: COVID-19 infection  Purpose of visit: Discussion of potential use of Molnupiravir or Paxlovid, a new treatment for mild to moderate COVID-19 viral infection in non-hospitalized patients.   Subjective: Patient is a 40 y.o. male who has been diagnosed with COVID 19 viral infection.  Their symptoms began on 4/7 with chills.    Past Medical History:  Diagnosis Date  . Hypertension 12/2016    No Known Allergies   Current Outpatient Medications:  .  amLODipine (NORVASC) 10 MG tablet, Take 1 tablet (10 mg total) by mouth at bedtime., Disp: 90 tablet, Rfl: 3 .  omeprazole (PRILOSEC) 40 MG capsule, TAKE 1 CAPSULE (40 MG TOTAL) BY MOUTH ONCE DAILY BEFORE BREAKFAST, Disp: 90 capsule, Rfl: 3 .  oseltamivir (TAMIFLU) 75 MG capsule, Take 1 capsule (75 mg total) by mouth every 12 (twelve) hours., Disp: 10 capsule, Rfl: 0  Objective: Patient appears/sounds well.  They are in no apparent distress.  Breathing is non labored.  Mood and behavior are normal.  Laboratory Data:  Recent Results (from the past 2160 hour(s))  Influenza panel by PCR (type A & B)     Status: None   Collection Time: 08/30/20  9:00 PM  Result Value Ref Range   Influenza A By PCR NEGATIVE NEGATIVE   Influenza B By PCR NEGATIVE NEGATIVE    Comment: Performed at [facility]  SARS CORONAVIRUS 2 (TAT 6-24 HRS) Nasopharyngeal Flu Kit  Nasopharyngeal Swab     Status: Abnormal   Collection Time: 08/30/20  9:00 PM   Specimen: Flu Kit Nasopharyngeal Swab  Result Value Ref Range   SARS Coronavirus 2 POSITIVE (A) NEGATIVE    Comment: (NOTE) SARS-CoV-2 target nucleic acids are DETECTED.  The SARS-CoV-2 RNA is generally detectable in upper and lower respiratory specimens during the acute phase of infection. Positive results are indicative of the presence of SARS-CoV-2 RNA. Clinical correlation with patient history and other diagnostic information is  necessary to determine patient infection status. Positive results do not rule out bacterial infection or co-infection with other viruses.  The expected result is Negative.  Fact Sheet for Patients: SugarRoll.be  Fact Sheet for Healthcare Providers: https://www.woods-mathews.com/  This test is not yet approved or cleared by the [place] FDA and  has been authorized for detection and/or diagnosis of SARS-CoV-2 by FDA under an Emergency Use Authorization (EUA). This EUA will remain  in effect (meaning this test can be used) for the duration of the COVID-19 declaration under Section 564(b)(1) of the Act, 21 U. S.C. section 360bbb-3(b)(1), unless the authorization is terminated or revoked sooner.   Performed at [facility], [place] [address] [place], [place] [address]      Assessment: 40 y.o. male with mild/moderate COVID 19 viral infection diagnosed on 4/7 at high risk for progression to severe COVID 19.  Plan:  This patient is a 40 y.o. male that meets the following criteria for Emergency Use Authorization of: Molnupiravir  1.  Age >18 yr 2. SARS-COV-2 positive test 3. Symptom onset < 5 days 4. Mild-to-moderate COVID disease with high risk for severe progression to hospitalization or death   I have spoken and communicated the following to the patient or parent/caregiver regarding: 1. Molnupiravir is an unapproved  drug that is authorized for use under an [employer] production planner.  2. There are no adequate, approved, available products for the treatment of COVID-19 in adults who have mild-to-moderate COVID-19 and are at high risk for progressing to severe COVID-19, including hospitalization or death. 3. Other therapeutics are currently authorized. For additional information on all products authorized for treatment or prevention of COVID-19, please see TanEmporium.pl.  4. There are benefits and risks of taking this treatment as outlined in the "Fact Sheet for Patients and Caregivers."  5. "Fact Sheet for Patients and Caregivers" was reviewed with patient. A hard copy will be provided to patient from pharmacy prior to the patient receiving treatment. 6. Patients should continue to self-isolate and use infection control measures (e.g., wear mask, isolate, social distance, avoid sharing personal items, clean and disinfect "high touch" surfaces, and frequent handwashing) according to CDC guidelines.  7. The patient or parent/caregiver has the option to accept or refuse treatment. 8. [place] has established a pregnancy surveillance program. 9. Females of childbearing potential should use a reliable method of contraception correctly and consistently, as applicable, for the duration of treatment and for 4 days after the last dose of Molnupiravir. 39. Males of reproductive potential who are sexually active with females of childbearing potential should use a reliable method of contraception correctly and consistently during treatment and for at least 3 months after the last dose. 11. Pregnancy status and risk was assessed. Patient verbalized understanding of precautions.   After reviewing above information with the patient, the patient agrees to receive molnupiravir.  Follow up instructions:     . Take prescription BID x 5 days as directed . Reach out to pharmacist for counseling on medication if desired . For concerns regarding further COVID symptoms please follow up with your PCP or urgent care . For urgent or life-threatening issues, seek care at your local emergency department  The patient was provided an opportunity to ask questions, and all were answered. The patient agreed with the plan and demonstrated an understanding of the instructions.   Script sent to and opted to The patient was advised to call their PCP or seek an in-person evaluation if the symptoms worsen or if the condition fails to improve as anticipated.   I provided 15 minutes of non face-to-face telephone visit time during this encounter, and > 50% was spent counseling as documented under my assessment & plan.  Kathrine Haddock, NP 09/02/2020 /9:43 AM

## 2020-09-02 NOTE — Telephone Encounter (Signed)
Called to discuss with patient about COVID-19 symptoms and the use of one of the available treatments for those with mild to moderate Covid symptoms and at a high risk of hospitalization.  Pt appears to qualify for outpatient treatment due to co-morbid conditions and/or a member of an at-risk group in accordance with the FDA Emergency Use Authorization.    LMOM  Ross Ryan   

## 2020-09-04 ENCOUNTER — Encounter: Payer: Self-pay | Admitting: Family Medicine

## 2020-09-04 ENCOUNTER — Telehealth (INDEPENDENT_AMBULATORY_CARE_PROVIDER_SITE_OTHER): Payer: Self-pay | Admitting: Family Medicine

## 2020-09-04 VITALS — HR 71 | Temp 98.6°F | Ht 76.0 in

## 2020-09-04 DIAGNOSIS — U071 COVID-19: Secondary | ICD-10-CM

## 2020-09-04 NOTE — Progress Notes (Signed)
Phone [phone] Virtual visit via Video note   Subjective:  Chief complaint: Chief Complaint  Patient presents with  . Covid Follow Up     Patient states that he's feeling better but he is having some lingering symptoms.    This visit type was conducted due to national recommendations for restrictions regarding the COVID-19 Pandemic (e.g. social distancing).  This format is felt to be most appropriate for this patient at this time balancing risks to patient and risks to population by having him in for in person visit.  No physical exam was performed (except for noted visual exam or audio findings with Telehealth visits).    Our team/I connected with Georgian Co at  3:00 PM EDT by a video enabled telemedicine application (doxy.me or caregility through epic) and verified that I am speaking with the correct person using two identifiers.  Location patient: [place] Location provider: [facility], office Persons participating in the virtual visit:  patient  Our team/I discussed the limitations of evaluation and management by telemedicine and the availability of in person appointments. In light of current covid-19 pandemic, patient also understands that we are trying to protect them by minimizing in office contact if at all possible.  The patient expressed consent for telemedicine visit and agreed to proceed. Patient understands insurance will be billed.   Past Medical History-  Patient Active Problem List   Diagnosis Date Noted  . Gastroesophageal reflux disease without esophagitis 11/02/2018    Priority: Medium  . 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/05/2018    Priority: Medium  . Hypertension 07/05/2018    Priority: Medium  . Throat irritation 07/05/2018    Priority: Low  . Weight loss 07/05/2018    Priority: Low  . 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 Vitamin B12 deficiency - oral repletion  02/02/2017    Priority: Low  .  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 amLODipine (NORVASC) 10 MG tablet Take 1 tablet (10 mg total) by mouth at bedtime. 90 tablet 3  . omeprazole (PRILOSEC) 40 MG capsule TAKE 1 CAPSULE (40 MG TOTAL) BY MOUTH ONCE DAILY BEFORE BREAKFAST 90 capsule 3   No current facility-administered medications for this visit.     Objective:  Pulse 71   Temp 98.6 F (37 C) (Oral)   Ht 6\' 4"  (1.93 m)   SpO2 97%   BMI 28.61 kg/m  self reported vitals Gen: NAD, resting comfortably, wearing mask Lungs: nonlabored, normal respiratory rate  Skin: appears dry, no obvious rash     Assessment and Plan   # Covid 19 follow up  S:symptoms started on 08/29/20 and woke up next morning feeling chills, fever, fatigue, lightheaded, temperature up to 103.7.   They initially thought it was flu and prescribed tamiflu and results came back soon. Home test was positive after he got home and later got PCR test back.  Night before last fever broke- woke up sweating. States appetite is improving. Minimal cough and congestion and that has improved. Oxygen levels stayed at 97%.   He was prescribed molnupiravir but opted not to take it- wanted to chat with me on visit today first but now on day 6 so not eligible.   Trying to stay well hydrated and get rest. Some lightheadedness still.   Not vaccinated  from covid 19.  A/P:  Patient seems to be recovering well from covid 19. Unvaccinated and now too late to take the molnupavir (day 6 at this point). Went over isolation and return precautions. For dizziness/lightheadedness we discussed pushing fluids more aggressively.  Glad he did not take tamiflu since flu test came back negative.    Recommended follow up:  Future Appointments  Date Time Provider Department Center  10/29/2020  9:20 AM Shelva Majestic, MD [facility]     Lab/Order associations:   ICD-10-CM   1. COVID-19  U07.1    Return precautions advised.  Tana Conch, MD

## 2020-09-11 ENCOUNTER — Encounter: Payer: Self-pay | Admitting: Family Medicine

## 2020-09-13 ENCOUNTER — Ambulatory Visit (INDEPENDENT_AMBULATORY_CARE_PROVIDER_SITE_OTHER): Payer: Self-pay | Admitting: Family Medicine

## 2020-09-13 ENCOUNTER — Encounter: Payer: Self-pay | Admitting: Family Medicine

## 2020-09-13 ENCOUNTER — Ambulatory Visit: Payer: Self-pay | Admitting: Family

## 2020-09-13 ENCOUNTER — Other Ambulatory Visit: Payer: Self-pay

## 2020-09-13 VITALS — BP 124/74 | HR 67 | Temp 98.2°F | Ht 76.0 in | Wt 220.6 lb

## 2020-09-13 DIAGNOSIS — H6591 Unspecified nonsuppurative otitis media, right ear: Secondary | ICD-10-CM

## 2020-09-13 DIAGNOSIS — I10 Essential (primary) hypertension: Secondary | ICD-10-CM

## 2020-09-13 MED ORDER — FLUTICASONE PROPIONATE 50 MCG/ACT NA SUSP: 2.0000 | Freq: Every day | NASAL | 1 refills | Status: DC

## 2020-09-13 NOTE — Patient Instructions (Addendum)
You have fluid behind your right eardrum-likely from inflammation from your recent COVID infection.  We are going to try to reduce the inflammation by starting you on a nasal steroid called Flonase/fluticasone-I want you to use 2 sprays each nostril daily.  Can also use a Neti pot if you would like.   You have a visit in early June- if you are not improving by then or if your symptoms worsen I want to refer you back to ENT- sometimes they have to put ear tubes in but I am strongly suspicious you will not need that  Continue gentle autoinsufflation   Recommended follow up: keep June visit or sooner if needed if symptoms worsen

## 2020-09-13 NOTE — Progress Notes (Signed)
Phone [phone] In person visit   Subjective:   Ross Ryan is a 40 y.o. year old very pleasant male patient who presents for/with See problem oriented charting Chief Complaint  Patient presents with  . Blockage in Right Ear     Has had this blockage since he had covid. He thought after he got over covid it would go away but it didn't .     This visit occurred during the SARS-CoV-2 public health emergency.  Safety protocols were in place, including screening questions prior to the visit, additional usage of staff PPE, and extensive cleaning of exam room while observing appropriate contact time as indicated for disinfecting solutions.   Past Medical History-  Patient Active Problem List   Diagnosis Date Noted  . Gastroesophageal reflux disease without esophagitis 11/02/2018    Priority: Medium  . 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/05/2018    Priority: Medium  . Hypertension 07/05/2018    Priority: Medium  . Throat irritation 07/05/2018    Priority: Low  . Weight loss 07/05/2018    Priority: Low  . 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 Vitamin B12 deficiency - oral repletion  02/02/2017    Priority: Low  . 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 amLODipine (NORVASC) 10 MG tablet Take 1 tablet (10 mg total) by mouth at bedtime. 90 tablet 3  . fluticasone (FLONASE) 50 MCG/ACT nasal spray Place 2 sprays into both nostrils daily. 16 g 1  . omeprazole (PRILOSEC) 40 MG capsule TAKE 1 CAPSULE (40 MG TOTAL) BY MOUTH ONCE DAILY BEFORE BREAKFAST 90 capsule 3   No current facility-administered medications for this visit.     Objective:  BP 124/74   Pulse 67   Temp 98.2 F (36.8 C) (Temporal)   Ht 6\' 4"  (1.93 m)   Wt 220 lb 9.6 oz (100.1 kg)   SpO2 98%   BMI 26.85 kg/m  Gen:  NAD, resting comfortably Left tympanic membrane normal, right tympanic membranes with air-fluid level and slightly yellow-colored fluid behind the eardrum consistent with otitis media with effusion.  Oropharynx normal, nasal cavity largely normal CV: RRR no murmurs rubs or gallops Lungs: CTAB no crackles, wheeze, rhonchi Ext: no edema Skin: warm, dry    Assessment and Plan   #Ear discomfort/fullness with hearing loss S: Patient with COVID-19 starting for August 29, 2020.  Patient thankfully recovered well from this.  Patient reports today that he was having nasal and ear congestion with covid and never cleared up. Left ear doing fine- right ear not hearing as well and feels full.  Certain positions like leaning heavily toward right side hearing improves but then worsens when sits back up.  A/P: Patient with right-sided otitis media with effusion on examination  From AVS "  Patient Instructions  You have fluid behind your right eardrum-likely from inflammation from your recent COVID infection.  We are going to try to reduce the inflammation by starting you on a nasal steroid called Flonase/fluticasone-I want you to use 2 sprays each nostril daily.  Can also use a Neti pot if you would like.   You have a visit in early June- if you are not improving by then or if your symptoms worsen I want to refer you back to ENT- sometimes they have to put  ear tubes in but I am strongly suspicious you will not need that  Continue gentle autoinsufflation   Recommended follow up: keep June visit or sooner if needed if symptoms worsen    " #hypertension S: medication: amlodipine 10 mg- interestingly enough has not had in a few days and still ok BP Readings from Last 3 Encounters:  09/13/20 124/74  08/30/20 (!) 141/80  06/16/19 126/88  A/P: Stable. Continue current medications. He is going to restart with prior elevations  # Smoking - 1 cigar per day- advised full cessation  Recommended follow up:  keep June visit unless needed sooner for acute concerns Future Appointments  Date Time Provider Department Center  10/29/2020  9:20 AM Shelva Majestic, MD [facility]    Lab/Order associations:   ICD-10-CM   1. Right otitis media with effusion  H65.91   2. Primary hypertension  I10     Meds ordered this encounter  Medications  . fluticasone (FLONASE) 50 MCG/ACT nasal spray    Sig: Place 2 sprays into both nostrils daily.    Dispense:  16 g    Refill:  1    Return precautions advised.  Tana Conch, MD

## 2020-10-29 ENCOUNTER — Other Ambulatory Visit: Payer: Self-pay

## 2020-10-29 ENCOUNTER — Other Ambulatory Visit (HOSPITAL_COMMUNITY)
Admission: RE | Admit: 2020-10-29 | Discharge: 2020-10-29 | Disposition: A | Discharge status: Home / Self Care | Payer: Self-pay | Source: Ambulatory Visit | Attending: Family Medicine | Admitting: Family Medicine

## 2020-10-29 ENCOUNTER — Encounter: Payer: Self-pay | Admitting: Family Medicine

## 2020-10-29 ENCOUNTER — Ambulatory Visit (INDEPENDENT_AMBULATORY_CARE_PROVIDER_SITE_OTHER): Payer: Self-pay | Admitting: Family Medicine

## 2020-10-29 VITALS — BP 138/80 | HR 60 | Temp 98.4°F | Resp 16 | Ht 76.0 in | Wt 219.4 lb

## 2020-10-29 DIAGNOSIS — Z113 Encounter for screening for infections with a predominantly sexual mode of transmission: Secondary | ICD-10-CM | POA: Insufficient documentation

## 2020-10-29 DIAGNOSIS — Z118 Encounter for screening for other infectious and parasitic diseases: Secondary | ICD-10-CM | POA: Insufficient documentation

## 2020-10-29 DIAGNOSIS — R351 Nocturia: Secondary | ICD-10-CM

## 2020-10-29 DIAGNOSIS — Z125 Encounter for screening for malignant neoplasm of prostate: Secondary | ICD-10-CM

## 2020-10-29 DIAGNOSIS — I10 Essential (primary) hypertension: Secondary | ICD-10-CM

## 2020-10-29 DIAGNOSIS — Z114 Encounter for screening for human immunodeficiency virus [HIV]: Secondary | ICD-10-CM

## 2020-10-29 DIAGNOSIS — R202 Paresthesia of skin: Secondary | ICD-10-CM

## 2020-10-29 DIAGNOSIS — Z1159 Encounter for screening for other viral diseases: Secondary | ICD-10-CM

## 2020-10-29 DIAGNOSIS — D564 Hereditary persistence of fetal hemoglobin [HPFH]: Secondary | ICD-10-CM

## 2020-10-29 DIAGNOSIS — F3342 Major depressive disorder, recurrent, in full remission: Secondary | ICD-10-CM

## 2020-10-29 DIAGNOSIS — Z0000 Encounter for general adult medical examination without abnormal findings: Secondary | ICD-10-CM | POA: Insufficient documentation

## 2020-10-29 LAB — PSA: PSA: 0.43 ng/mL (ref 0.10–4.00)

## 2020-10-29 LAB — CBC WITH DIFFERENTIAL/PLATELET
Basophils Absolute: 0 10*3/uL (ref 0.0–0.1)
Basophils Relative: 0.9 % (ref 0.0–3.0)
Eosinophils Absolute: 0.1 10*3/uL (ref 0.0–0.7)
Eosinophils Relative: 1.5 % (ref 0.0–5.0)
HCT: 41.5 % (ref 39.0–52.0)
Hemoglobin: 13.3 g/dL (ref 13.0–17.0)
Lymphocytes Relative: 43.1 % (ref 12.0–46.0)
Lymphs Abs: 1.6 10*3/uL (ref 0.7–4.0)
MCHC: 32.1 g/dL (ref 30.0–36.0)
MCV: 81.8 fl (ref 78.0–100.0)
Monocytes Absolute: 0.4 10*3/uL (ref 0.1–1.0)
Monocytes Relative: 12 % (ref 3.0–12.0)
Neutro Abs: 1.6 10*3/uL (ref 1.4–7.7)
Neutrophils Relative %: 42.5 % — ABNORMAL LOW (ref 43.0–77.0)
Platelets: 212 10*3/uL (ref 150.0–400.0)
RBC: 5.07 Mil/uL (ref 4.22–5.81)
RDW: 14.2 % (ref 11.5–15.5)
WBC: 3.7 10*3/uL — ABNORMAL LOW (ref 4.0–10.5)

## 2020-10-29 LAB — VITAMIN B12: Vitamin B-12: 392 pg/mL (ref 211–911)

## 2020-10-29 LAB — POC URINALSYSI DIPSTICK (AUTOMATED)
Bilirubin, UA: NEGATIVE
Blood, UA: NEGATIVE
Glucose, UA: NEGATIVE
Ketones, UA: NEGATIVE
Leukocytes, UA: NEGATIVE
Nitrite, UA: NEGATIVE
Protein, UA: NEGATIVE
Spec Grav, UA: 1.025 (ref 1.010–1.025)
Urobilinogen, UA: 0.2 E.U./dL
pH, UA: 6 (ref 5.0–8.0)

## 2020-10-29 LAB — TSH: TSH: 0.87 u[IU]/mL (ref 0.35–4.50)

## 2020-10-29 NOTE — Progress Notes (Signed)
Phone: [phone]    Subjective:  Patient presents today for their annual physical. Chief complaint-noted.   See problem oriented charting- ROS- full  review of systems was completed and negative  except for: Tingling, anxiety, increased urination, sweating, some joint pain  The following were reviewed and entered/updated in epic: Past Medical History:  Diagnosis Date  . Hypertension 12/2016   Patient Active Problem List   Diagnosis Date Noted  . Gastroesophageal reflux disease without esophagitis 11/02/2018    Priority: Medium  . Major depression in full remission ([place]) 07/05/2018    Priority: Medium  . Hypertension 07/05/2018    Priority: Medium  . Throat irritation 07/05/2018    Priority: Low  . Weight loss 07/05/2018    Priority: Low  . 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 Vitamin B12 deficiency - oral repletion  02/02/2017    Priority: Low  . Hereditary persistence of fetal hemoglobin ([place]) 12/31/2016    Priority: Low   Past Surgical History:  Procedure Laterality Date  . Fatty tumor removed from right Posterior forearm Right 2010  . KNEE SURGERY      Family History  Problem Relation Age of Onset  . Hypertension Mother   . Diabetes Father   . Hyperlipidemia Father   . Hypertension Father   . Healthy Sister   . Healthy Brother   . Dementia Maternal Grandmother   . Other Maternal Grandfather        doesnt know cause of death  . Heart disease Paternal Grandmother   . Other Paternal Grandfather        doesnt know cause of death    Medications- reviewed and updated Current Outpatient Medications  Medication Sig Dispense Refill  . fluticasone (FLONASE) 50 MCG/ACT nasal spray Place 2 sprays into both nostrils daily. 16 g 1  . omeprazole (PRILOSEC) 40 MG capsule TAKE 1 CAPSULE (40 MG TOTAL) BY MOUTH ONCE DAILY BEFORE BREAKFAST 90 capsule 3   No current facility-administered medications for this visit.     Allergies-reviewed and updated No Known Allergies  Social History   Social History Narrative   Lives alone.       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 Oropharynx normal, TMs and canals have slight cerumen visible , nasal turbinates normal,  Neck: no thyromegaly, no lymphadenopathy CV: RRR no murmurs rubs or gallops, no carotid bruit Lungs: CTAB no crackles, wheeze, rhonchi Abdomen: soft/nontender/nondistended/normal bowel sounds. No rebound or guarding.  Ext: no edema Skin: warm, dry, behind lower portion of left upper leg with multiple small circular dry scaly areas concenring for eczematous disease  Neuro: grossly normal, moves all extremities, PERRLA     Assessment and Plan:  40 y.o. male presenting for annual physical.  Health Maintenance counseling: 1. Anticipatory guidance: Patient counseled regarding regular dental exams-q6 months, eye exams-yearly,  avoiding smoking and second hand smoke-smoking 2-3 daily, limiting alcohol to 2 beverages per day.   2.  Risk factor reduction:  Advised patient of need for regular exercise and diet rich and fruits and vegetables to reduce risk of heart attack and stroke. Exercise-Working out 1-2 times a week. Diet-Portion size control-he is only eating once a day.  Wt Readings from Last 3 Encounters:  10/29/20 219 lb 6.4 oz (99.5 kg)  09/13/20 220 lb 9.6 oz (100.1 kg)  08/30/20 235 lb (106.6 kg)   3. Immunizations/screenings/ancillary studies-discussed one-time lifetime hepatitis C screening-declines COVID-19 vaccination.  Immunization History  Administered  Date(s) Administered  . DTaP 02/07/1983, 05/06/1983, 08/22/1983, 04/07/1986, 09/28/1987  . Hepatitis A 11/09/2017, 05/12/2018  . Hepatitis B 11/09/2017, 01/08/2018, 05/12/2018  . IPV 02/07/1983, 05/06/1983, 08/22/1983, 09/28/1987  . MMR 04/16/1984  . PPD Test 02/29/2016  . Tdap 07/05/2018    4. Prostate cancer screening-  No results found for: PSA with nocturnal twice a night so we will check a PSA  5. Colon cancer screening - no family history, start at age 39 6. Skin cancer screening/prevention- lower risk due to melanoma content. advised regular sunscreen use. Denies worrisome, changing, or new skin lesions.  7. Testicular cancer screening- advised monthly self exams  8. STD screening- patient opts in 9. 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  Has an upcoming birthday and will be celebrating with the [facility]  # Essential Hypertension 120-130s/70s without regular amlodipine-has taken perhaps twice a week but blood pressure is controlled even on days he is not taking this BP Readings from Last 3 Encounters:  10/29/20 138/80  09/13/20 124/74  08/30/20 (!) 141/80  A/P:  Controlled with diet and exercise- congratulated on weight loss-taking patient off amlodipine-discussed if blood pressure remains less than 135/85 at home can continue without medicine  # Depression S: Medication: none Depression screen [facility] 2/9 10/29/2020 09/04/2020 01/03/2020  Decreased Interest 0 0 0  Down, Depressed, Hopeless 0 0 0  PHQ - 2 Score 0 0 0  Altered sleeping 2 0 0  Tired, decreased energy 0 0 0  Change in appetite 0 0 0  Feeling bad or failure about yourself  0 0 0  Trouble concentrating 0 0 0  Moving slowly or fidgety/restless 0 0 0  Suicidal thoughts 0 0 0  PHQ-9 Score 2 0 0  Difficult doing work/chores Not difficult at all Not difficult at all Not difficult at all  A/P:  full remission without  medication-continue to monitor.  Does not feel the need to return to Dr. Daron Offer at this time  #[place]  S: Patient has been feeling more anxious and irritabile A/P: GAD-7 elevated above 10 today- patient is interested in counseling but not medication at this time-referral to behavioral health with [facility]-information given  # Tingling Sensation in Arms S:On and off tingling sensation in his arms bilaterally sometimes at night and occasionally during the day. This occurs every other day for sometime now. Denies having any tingling in his neck.  A/P: Possible neuropathy-offered referral to neurology or sports medicine-patient declines for now.  We will lease check B12 and TSH level -Also feels like he is sweating more than normal-TSH is a good thing to check  # Increased Urination S:Patient has had increased urination that has occurred for years. He notes waking up in the middle of the night 2-3 times.  A/P: We will update a PSA and get a urinalysis to establish baseline  # Joint Pain S:Has some knee  crackling but he contributes this to age  A/P: Mild issue-patient wants to continue to monitor  #GERD  S:Medication: omeprazole 40 mg  A/P: Omeprazole can sometimes cause sweating- if persistent issues consider trialing off medication or reducing dose- will be reasonable to check in on this at next visit and try to reduce down  Recommended follow up: Return in about 6 months (around 04/30/2021) for follow up- or sooner if needed.  Lab/Order associations:fasting   ICD-10-CM   1. Preventative health care  Z00.00 Hepatitis C Antibody    Vitamin B12    TSH    CBC with Differential/Platelet    Comprehensive metabolic panel    Lipid panel    POCT Urinalysis Dipstick (Automated)    PSA    HIV Antibody (routine testing w rflx)    RPR    Urine cytology ancillary only  2. Hypertension, unspecified type  I10 CBC with Differential/Platelet    Comprehensive metabolic panel    Lipid panel  3.  Recurrent major depressive disorder, in full remission ([place]) Chronic F33.42   4. Hereditary persistence of fetal hemoglobin (HCC) Chronic D56.4   5. Tingling of both upper extremities  R20.2 Vitamin B12    TSH  6. Encounter for hepatitis C screening test for low risk patient  Z11.59 Hepatitis C Antibody  7. Nocturia  R35.1 POCT Urinalysis Dipstick (Automated)  8. Screening for prostate cancer  Z12.5 PSA  9. Screening for HIV (human immunodeficiency virus)  Z11.4 HIV Antibody (routine testing w rflx)  10. Screening examination for venereal disease  Z11.3 RPR  11. Screening for gonorrhea  Z11.3 Urine cytology ancillary only  12. Screening for chlamydial disease  Z11.8 Urine cytology ancillary only   I,Alexis Bryant,acting as a scribe for Garret Reddish, MD.,have documented all relevant documentation on the behalf of Garret Reddish, MD,as directed by  Garret Reddish, MD while in the presence of Garret Reddish, MD.   I, Garret Reddish, MD, have reviewed all documentation for this visit. The documentation on 10/29/20 for the exam, diagnosis, procedures, and orders are all accurate and complete.   Return precautions advised.   Garret Reddish, MD

## 2020-10-29 NOTE — Addendum Note (Signed)
Addended by: Vincenza Hews on: 10/29/2020 10:08 AM   Modules accepted: Orders

## 2020-10-29 NOTE — Patient Instructions (Addendum)
Please call [phone] to schedule a visit with [facility]  As long as home blood pressure less than 135/85 on average may remain off of amlodipine-congratulations on this improving with weight loss!  Please stop by lab before you go If you have mychart- we will send your results within 3 business days of [place] receiving them.  If you do not have mychart- we will call you about results within 5 business days of [place] receiving them.  *please also note that you will see labs on mychart as soon as they post. I will later go in and write notes on them- will say "notes from Dr. Durene Cal"  Recommended follow up: Return in about 6 months (around 04/30/2021) for follow up- or sooner if needed. just to keep an eye on blood pressure.

## 2020-10-30 LAB — COMPREHENSIVE METABOLIC PANEL
ALT: 18 U/L (ref 0–53)
AST: 16 U/L (ref 0–37)
Albumin: 4.5 g/dL (ref 3.5–5.2)
Alkaline Phosphatase: 55 U/L (ref 39–117)
BUN: 13 mg/dL (ref 6–23)
CO2: 27 mEq/L (ref 19–32)
Calcium: 9.7 mg/dL (ref 8.4–10.5)
Chloride: 104 mEq/L (ref 96–112)
Creatinine, Ser: 1.14 mg/dL (ref 0.40–1.50)
GFR: 81.93 mL/min (ref >60.00)
Glucose, Bld: 93 mg/dL (ref 70–99)
Potassium: 4.1 mEq/L (ref 3.5–5.1)
Sodium: 140 mEq/L (ref 135–145)
Total Bilirubin: 0.4 mg/dL (ref 0.2–1.2)
Total Protein: 7.3 g/dL (ref 6.0–8.3)

## 2020-10-30 LAB — URINE CYTOLOGY ANCILLARY ONLY
Chlamydia: NEGATIVE
Comment: NEGATIVE
Comment: NEGATIVE
Comment: NORMAL
Neisseria Gonorrhea: NEGATIVE
Trichomonas: NEGATIVE

## 2020-10-30 LAB — LIPID PANEL
Cholesterol: 103 mg/dL (ref 0–200)
HDL: 41.5 mg/dL (ref >39.00)
LDL Cholesterol: 50 mg/dL (ref 0–99)
NonHDL: 61.77
Total CHOL/HDL Ratio: 2
Triglycerides: 60 mg/dL (ref 0.0–149.0)
VLDL: 12 mg/dL (ref 0.0–40.0)

## 2020-10-30 LAB — HIV ANTIBODY (ROUTINE TESTING W REFLEX): HIV 1&2 Ab, 4th Generation: NONREACTIVE

## 2020-10-30 LAB — HEPATITIS C ANTIBODY
Hepatitis C Ab: NONREACTIVE
SIGNAL TO CUT-OFF: 0 (ref <1.00)

## 2020-10-30 LAB — RPR: RPR Ser Ql: NONREACTIVE

## 2020-11-15 ENCOUNTER — Emergency Department (HOSPITAL_BASED_OUTPATIENT_CLINIC_OR_DEPARTMENT_OTHER)
Admission: EM | Admit: 2020-11-15 | Discharge: 2020-11-15 | Disposition: A | Discharge status: Home / Self Care | Payer: Self-pay | Attending: Emergency Medicine | Admitting: Emergency Medicine

## 2020-11-15 ENCOUNTER — Encounter (HOSPITAL_BASED_OUTPATIENT_CLINIC_OR_DEPARTMENT_OTHER): Payer: Self-pay | Admitting: *Deleted

## 2020-11-15 ENCOUNTER — Other Ambulatory Visit: Payer: Self-pay

## 2020-11-15 DIAGNOSIS — R519 Headache, unspecified: Secondary | ICD-10-CM | POA: Insufficient documentation

## 2020-11-15 DIAGNOSIS — R110 Nausea: Secondary | ICD-10-CM | POA: Insufficient documentation

## 2020-11-15 DIAGNOSIS — H53149 Visual discomfort, unspecified: Secondary | ICD-10-CM | POA: Insufficient documentation

## 2020-11-15 DIAGNOSIS — I10 Essential (primary) hypertension: Secondary | ICD-10-CM | POA: Insufficient documentation

## 2020-11-15 MED ORDER — KETOROLAC TROMETHAMINE 15 MG/ML IJ SOLN
15.0000 mg | Freq: Once | INTRAMUSCULAR | Status: AC
  Administered 2020-11-15: 15 mg via INTRAVENOUS
  Filled 2020-11-15: qty 1

## 2020-11-15 MED ORDER — SODIUM CHLORIDE 0.9 % IV BOLUS
1000.0000 mL | Freq: Once | INTRAVENOUS | Status: AC
  Administered 2020-11-15: 1000 mL via INTRAVENOUS

## 2020-11-15 MED ORDER — DEXAMETHASONE SODIUM PHOSPHATE 10 MG/ML IJ SOLN
10.0000 mg | Freq: Once | INTRAMUSCULAR | Status: AC
  Administered 2020-11-15: 10 mg via INTRAVENOUS
  Filled 2020-11-15: qty 1

## 2020-11-15 NOTE — ED Notes (Signed)
States has had headache for the week.  Starters at top of head and radiates down right side.

## 2020-11-15 NOTE — ED Notes (Signed)
States headache better

## 2020-11-15 NOTE — Discharge Instructions (Addendum)
If you develop continued, recurrent, or worsening headache, fever, neck stiffness, vomiting, blurry or double vision, weakness or numbness in your arms or legs, trouble speaking, or any other new/concerning symptoms then return to the ER for evaluation.  

## 2020-11-15 NOTE — ED Provider Notes (Signed)
[facility] EMERGENCY DEPT Provider Note   CSN: [id number] Arrival date & time: 11/15/20  1150     History Chief Complaint  Patient presents with   Headache    Ross Ryan is a 40 y.o. male.  HPI 40 year old male presents with headache.  Started off 2 or 3 days ago in the middle the night.  Has been waxing and waning since.  Primarily is at the top of his head and then right-sided and also a little behind his eye.  No vision changes though sometimes he has photophobia.  Some nausea but no vomiting.  No weakness or numbness.  No neck pain or stiffness or fevers.  Right now the pain is about a 6 out of 10.  It waxes and wanes, usually worse at night.  Seems to be a mix between throbbing, aching and dull.  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    Past Surgical History:  Procedure Laterality Date   Fatty tumor removed from right Posterior forearm Right 2010   KNEE SURGERY         Family History  Problem Relation Age of Onset   Hypertension Mother    Diabetes Father    Hyperlipidemia Father    Hypertension Father    Healthy Sister    Healthy Brother    Dementia Maternal Grandmother    Other Maternal Grandfather        doesnt know cause of death   Heart disease Paternal Grandmother    Other Paternal Grandfather        doesnt know cause of death    Social History   Tobacco Use   Smoking status: Never   Smokeless tobacco: Never   Tobacco comments:    smokes cigars socially- encouraged  cessation  Vaping Use   Vaping Use: Never used  Substance Use Topics   Alcohol use: Yes    Comment: social   Drug use: Yes    Types: Marijuana    Comment: daily    Home Medications Prior to Admission medications   Medication Sig Start Date End Date Taking? Authorizing Provider  omeprazole (PRILOSEC) 40 MG capsule TAKE 1 CAPSULE (40 MG TOTAL) BY MOUTH ONCE DAILY BEFORE BREAKFAST 04/16/20   Shelva Majestic, MD    Allergies    Patient has no known allergies.  Review of Systems   Review of Systems  Constitutional:  Negative for fever.  Eyes:  Positive for photophobia. Negative for visual disturbance.  Gastrointestinal:  Positive for nausea. Negative for vomiting.  Musculoskeletal:  Negative for neck pain and neck stiffness.  Neurological:  Positive for headaches. Negative for weakness and numbness.  All other systems reviewed and are negative.  Physical Exam Updated Vital Signs BP 132/88 (BP Location: Right Arm)   Pulse (!) 56   Temp 98.5 F (36.9 C) (Oral)   Resp 17   Ht 6\' 4"  (1.93 m)   Wt 95.7 kg   SpO2 100%   BMI 25.68 kg/m   Physical Exam Vitals and nursing note reviewed.  Constitutional:      General: He is not in acute distress.    Appearance: He is well-developed. He is not ill-appearing or diaphoretic.  HENT:     Head: Normocephalic and atraumatic.     Comments: No scalp tenderness or tenderness over temple    Right Ear: External ear normal.     Left Ear: External ear normal.     Nose: Nose normal.  Eyes:     General:        Right eye: No discharge.        Left eye: No discharge.     Extraocular Movements: Extraocular movements intact.     Pupils: Pupils are equal, round, and reactive to light.  Cardiovascular:     Rate and Rhythm: Normal rate and regular rhythm.     Heart sounds: Normal heart sounds.  Pulmonary:     Effort: Pulmonary effort is normal.     Breath sounds: Normal breath sounds.  Abdominal:     Palpations: Abdomen is soft.      Tenderness: There is no abdominal tenderness.  Musculoskeletal:     Cervical back: Normal range of motion and neck supple.  Skin:    General: Skin is warm and dry.  Neurological:     Mental Status: He is alert.     Comments: CN 3-12 grossly intact. 5/5 strength in all 4 extremities. Grossly normal sensation. Normal finger to nose.   Psychiatric:        Mood and Affect: Mood is not anxious.    ED Results / Procedures / Treatments   Labs (all labs ordered are listed, but only abnormal results are displayed) Labs Reviewed - No data to display  EKG None  Radiology No results found.  Procedures Procedures   Medications Ordered in ED Medications  sodium chloride 0.9 % bolus 1,000 mL (0 mLs Intravenous Stopped 11/15/20 1326)  ketorolac (TORADOL) 15 MG/ML injection 15 mg (15 mg Intravenous Given 11/15/20 1222)  dexamethasone (DECADRON) injection 10 mg (10 mg Intravenous Given 11/15/20 1222)    ED Course  I have reviewed the triage vital signs and the nursing notes.  Pertinent labs & imaging results that were available during my care of the patient were reviewed by me and considered in my medical decision making (see chart for details).    MDM Rules/Calculators/A&P                          Given the unilateral symptoms and some behind the eye pain, this could be a mild migraine.  He is not vomiting.  He is well-appearing and with a benign exam.  I do not think he needs emergent imaging and do not think labs would be beneficial, especially given he had some just a few days ago.  At this point, given improvement of symptoms I think he is stable for discharge home with return precautions.  Highly doubt acute CNS emergency such as subarachnoid hemorrhage, mass, infection, etc. Final Clinical Impression(s) / ED Diagnoses Final diagnoses:  Right-sided headache    Rx / DC Orders ED Discharge Orders     None        Pricilla Loveless, MD 11/15/20 1328

## 2020-11-15 NOTE — ED Triage Notes (Addendum)
Throbbing headache since Tuesday.  He was on BP medicine and PCP took him off from this medicine a month ago.

## 2020-11-22 ENCOUNTER — Emergency Department (HOSPITAL_BASED_OUTPATIENT_CLINIC_OR_DEPARTMENT_OTHER): Payer: Self-pay

## 2020-11-22 ENCOUNTER — Emergency Department (HOSPITAL_BASED_OUTPATIENT_CLINIC_OR_DEPARTMENT_OTHER)
Admission: EM | Admit: 2020-11-22 | Discharge: 2020-11-22 | Disposition: A | Discharge status: Home / Self Care | Payer: Self-pay | Attending: Emergency Medicine | Admitting: Emergency Medicine

## 2020-11-22 ENCOUNTER — Encounter (HOSPITAL_BASED_OUTPATIENT_CLINIC_OR_DEPARTMENT_OTHER): Payer: Self-pay | Admitting: Emergency Medicine

## 2020-11-22 DIAGNOSIS — R519 Headache, unspecified: Secondary | ICD-10-CM | POA: Insufficient documentation

## 2020-11-22 DIAGNOSIS — H538 Other visual disturbances: Secondary | ICD-10-CM | POA: Insufficient documentation

## 2020-11-22 DIAGNOSIS — H9201 Otalgia, right ear: Secondary | ICD-10-CM | POA: Insufficient documentation

## 2020-11-22 DIAGNOSIS — H53149 Visual discomfort, unspecified: Secondary | ICD-10-CM | POA: Insufficient documentation

## 2020-11-22 DIAGNOSIS — I10 Essential (primary) hypertension: Secondary | ICD-10-CM | POA: Insufficient documentation

## 2020-11-22 LAB — CBC WITH DIFFERENTIAL/PLATELET
Abs Immature Granulocytes: 0.01 10*3/uL (ref 0.00–0.07)
Basophils Absolute: 0 10*3/uL (ref 0.0–0.1)
Basophils Relative: 0 %
Eosinophils Absolute: 0 10*3/uL (ref 0.0–0.5)
Eosinophils Relative: 1 %
HCT: 40.3 % (ref 39.0–52.0)
Hemoglobin: 12.8 g/dL — ABNORMAL LOW (ref 13.0–17.0)
Immature Granulocytes: 0 %
Lymphocytes Relative: 46 %
Lymphs Abs: 2.5 10*3/uL (ref 0.7–4.0)
MCH: 26.2 pg (ref 26.0–34.0)
MCHC: 31.8 g/dL (ref 30.0–36.0)
MCV: 82.6 fL (ref 80.0–100.0)
Monocytes Absolute: 0.8 10*3/uL (ref 0.1–1.0)
Monocytes Relative: 14 %
Neutro Abs: 2.2 10*3/uL (ref 1.7–7.7)
Neutrophils Relative %: 39 %
Platelets: 186 10*3/uL (ref 150–400)
RBC: 4.88 MIL/uL (ref 4.22–5.81)
RDW: 14.4 % (ref 11.5–15.5)
WBC: 5.5 10*3/uL (ref 4.0–10.5)
nRBC: 0 % (ref 0.0–0.2)

## 2020-11-22 LAB — COMPREHENSIVE METABOLIC PANEL
ALT: 27 U/L (ref 0–44)
AST: 20 U/L (ref 15–41)
Albumin: 3.9 g/dL (ref 3.5–5.0)
Alkaline Phosphatase: 48 U/L (ref 38–126)
Anion gap: 8 (ref 5–15)
BUN: 12 mg/dL (ref 6–20)
CO2: 26 mmol/L (ref 22–32)
Calcium: 9.2 mg/dL (ref 8.9–10.3)
Chloride: 108 mmol/L (ref 98–111)
Creatinine, Ser: 1.19 mg/dL (ref 0.61–1.24)
GFR, Estimated: >60 mL/min (ref >60)
Glucose, Bld: 88 mg/dL (ref 70–99)
Potassium: 4.1 mmol/L (ref 3.5–5.1)
Sodium: 142 mmol/L (ref 135–145)
Total Bilirubin: 0.6 mg/dL (ref 0.3–1.2)
Total Protein: 6.8 g/dL (ref 6.5–8.1)

## 2020-11-22 MED ORDER — SUMATRIPTAN SUCCINATE 100 MG PO TABS: 100.0000 mg | ORAL_TABLET | ORAL | 0 refills | Status: DC | PRN

## 2020-11-22 MED ORDER — METOCLOPRAMIDE HCL 5 MG/ML IJ SOLN
10.0000 mg | Freq: Once | INTRAMUSCULAR | Status: AC
  Administered 2020-11-22: 10 mg via INTRAVENOUS
  Filled 2020-11-22: qty 2

## 2020-11-22 NOTE — ED Triage Notes (Signed)
Pt from home complains of migraine headaches off and on for the past 2 weeks. Pt states the current migrane started last night and is felt more on the right side of the head.   Pt Ross Ryan N/V, Pt does state he has blurry vision.

## 2020-11-22 NOTE — ED Notes (Signed)
Pt A&OX4, ambulatory at d/c with independent steady gait, NAD. Pt verbalized understanding of d/c instructions, prescription and follow up care. 

## 2020-11-22 NOTE — ED Notes (Signed)
Present with headache.  Seen here for same one week ago.  States headache was better for three days then came back.  Up last night with headache and since this afternoon.  Describe pain to ride side of head.

## 2020-11-22 NOTE — ED Provider Notes (Signed)
[facility] EMERGENCY DEPT Provider Note   CSN: [id number] Arrival date & time: 11/22/20  1812     History Chief Complaint  Patient presents with   Migraine    Ross Ryan is a 40 y.o. male.  He was seen here 1 week ago.  At that time it was thought that he potentially had migraine headaches.  He has been having headaches for 2 to 3 weeks intermittently.  After discharge from the ED, he had a couple days without a headache, but it came back.  He had difficulty sleeping last night from the pain.  He was pain-free until 4 PM today came back.  Prior to his headaches this month, he has not had a history of chronic headaches or migraines.  The history is provided by the patient.  Headache Pain location:  R temporal and occipital Quality: throbbing. Radiates to:  Does not radiate Severity currently:  10/10 Severity at highest:  10/10 Onset quality:  Sudden Duration:  4 hours Timing:  Constant Progression:  Unchanged Chronicity:  Recurrent Similar to prior headaches: no   Context comment:  No obvious cause Relieved by:  Nothing Worsened by:  Light Ineffective treatments:  NSAIDs Associated symptoms: blurred vision, ear pain (right) and photophobia   Associated symptoms: no abdominal pain, no back pain, no congestion, no cough, no dizziness, no eye pain, no facial pain, no fever, no focal weakness, no hearing loss, no loss of balance, no nausea, no neck pain, no neck stiffness, no numbness, no paresthesias, no seizures, no sore throat, no tingling, no URI and no vomiting       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[facility]  04/02/2017    Vitamin B12 deficiency - oral repletion  02/02/2017   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    Past Surgical History:  Procedure Laterality Date   Fatty tumor removed from right Posterior forearm Right 2010   KNEE SURGERY         Family History  Problem Relation Age of Onset   Hypertension Mother    Diabetes Father    Hyperlipidemia Father    Hypertension Father    Healthy Sister    Healthy Brother    Dementia Maternal Grandmother    Other Maternal Grandfather        doesnt know cause of death   Heart disease Paternal Grandmother    Other Paternal Grandfather        doesnt know cause of death    Social History   Tobacco Use   Smoking status: Never   Smokeless tobacco: Never   Tobacco comments:    smokes cigars socially- encouraged cessation  Vaping Use   Vaping Use: Never used  Substance Use Topics   Alcohol use: Yes    Comment: social   Drug use: Yes    Types: Marijuana    Comment: daily    Home Medications Prior to Admission medications   Medication Sig Start Date End Date Taking? Authorizing Provider  omeprazole (PRILOSEC) 40 MG capsule TAKE 1 CAPSULE (40 MG TOTAL) BY MOUTH ONCE DAILY BEFORE BREAKFAST 04/16/20   Shelva Majestic, MD    Allergies  Patient has no known allergies.  Review of Systems   Review of Systems  Constitutional:  Negative for chills and fever.  HENT:  Positive for ear pain (right). Negative for congestion, hearing loss and sore throat.   Eyes:  Positive for blurred vision and photophobia. Negative for pain and visual disturbance.  Respiratory:  Negative for cough and shortness of breath.   Cardiovascular:  Negative for chest pain and palpitations.  Gastrointestinal:  Negative for abdominal pain, nausea and vomiting.  Genitourinary:  Negative for dysuria and hematuria.  Musculoskeletal:  Negative for arthralgias, back pain, neck pain and neck stiffness.  Skin:  Negative for color change and rash.   Neurological:  Positive for headaches. Negative for dizziness, focal weakness, seizures, syncope, numbness, paresthesias and loss of balance.  All other systems reviewed and are negative.  Physical Exam Updated Vital Signs BP 134/87 (BP Location: Right Arm)   Pulse 60   Temp 98.4 F (36.9 C) (Oral)   Resp 17   Ht 6\' 4"  (1.93 m)   Wt 95.3 kg   SpO2 100%   BMI 25.56 kg/m   Physical Exam Vitals and nursing note reviewed.  Constitutional:      Appearance: Normal appearance.  HENT:     Head: Normocephalic and atraumatic.     Right Ear: Ear canal and external ear normal.     Left Ear: Tympanic membrane, ear canal and external ear normal.     Ears:     Comments: Minimal erythema of the right TM     Nose: Nose normal.     Mouth/Throat:     Mouth: Mucous membranes are moist.  Eyes:     Conjunctiva/sclera: Conjunctivae normal.  Pulmonary:     Effort: Pulmonary effort is normal. No respiratory distress.  Musculoskeletal:        General: No deformity. Normal range of motion.     Cervical back: Normal range of motion. No rigidity or tenderness.  Lymphadenopathy:     Cervical: No cervical adenopathy.  Skin:    General: Skin is warm and dry.  Neurological:     General: No focal deficit present.     Mental Status: He is alert and oriented to person, place, and time. Mental status is at baseline.     Cranial Nerves: No cranial nerve deficit.     Sensory: No sensory deficit.     Motor: No weakness.     Coordination: Coordination normal.  Psychiatric:        Mood and Affect: Mood normal.    ED Results / Procedures / Treatments   Labs (all labs ordered are listed, but only abnormal results are displayed) Labs Reviewed  CBC WITH DIFFERENTIAL/PLATELET - Abnormal; Notable for the following components:      Result Value   Hemoglobin 12.8 (*)    All other components within normal limits  COMPREHENSIVE METABOLIC PANEL    EKG None  Radiology CT Head Wo Contrast  Result  Date: 11/22/2020 CLINICAL DATA:  Headache. EXAM: CT HEAD WITHOUT CONTRAST TECHNIQUE: Contiguous axial images were obtained from the base of the skull through the vertex without intravenous contrast. COMPARISON:  CT head dated 08/28/2018. FINDINGS: Brain: No evidence of acute infarction, hemorrhage, hydrocephalus, extra-axial collection or mass lesion/mass effect. Vascular: No hyperdense vessel or unexpected calcification. Skull: Normal. Negative for fracture or focal lesion. Sinuses/Orbits: There is chronic opacification of the bilateral mastoid air cells. The paranasal sinuses appear normal. Other: None. IMPRESSION: No acute intracranial process. 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 Electronically Signed   By: Romona Curls M.D.   On: 11/22/2020 20:13    Procedures Procedures   Medications Ordered in ED Medications  metoCLOPramide (REGLAN) injection 10 mg (has no administration in time range)    ED Course  I have reviewed the triage vital signs and the nursing notes.  Pertinent labs & imaging results that were available during my care of the patient were reviewed by me and considered in my medical decision making (see chart for details).    MDM Rules/Calculators/A&P                          Ross Ryan presented with a severe right retro-orbital headache since 4 PM.  Has been having intermittent headaches for several weeks, and this is his second ED visit for the same.  Prior to this month, he did not have a history of headaches.  He had a normal neurologic exam.  He had no meningeal signs.  No signs or symptoms of infection.  Because he had not had a previous history of headache and had presented twice, I did order a CT scan to evaluate for a large mass, bleed, or other emergent pathology.  This was negative.  He achieved pain relief with Reglan.  I did consider vascular pathology such as dural venous sinus thrombosis or arterial dissection.  He does seem to be  unlikely.  The headaches have been intermittent and not persistent.  No associated neurologic symptoms.  I did recommend he follow-up with neurology if headaches are persistent. Final Clinical Impression(s) / ED Diagnoses Final diagnoses:  Acute nonintractable headache, unspecified headache type    Rx / DC Orders ED Discharge Orders          Ordered    SUMAtriptan (IMITREX) 100 MG tablet  Every 2 hours PRN        11/22/20 2056             Koleen Distance, MD 11/22/20 2100

## 2020-11-23 ENCOUNTER — Telehealth (HOSPITAL_BASED_OUTPATIENT_CLINIC_OR_DEPARTMENT_OTHER): Payer: Self-pay | Admitting: Emergency Medicine

## 2020-11-23 ENCOUNTER — Telehealth: Payer: Self-pay

## 2020-11-23 ENCOUNTER — Encounter: Payer: Self-pay | Admitting: Family Medicine

## 2020-11-23 NOTE — Telephone Encounter (Signed)
Spoke to pt told him looks like someone from ED called you and told you to follow up with PCP or go to ED if worsen. Pt said yes, but ED is not helping him. Told pt I am sorry but there are no appointments available in office today. I see you have an appointment on Tuesday with Alyssa. Pt said yes,but is there nothing you can do now. Told pt all I can suggest is if symptoms worsen and you can not wait till Tuesday to be seen then you will need to go to Urgent care or ED again. Pt verbalized understanding.

## 2020-11-23 NOTE — Telephone Encounter (Signed)
Nurse Assessment Nurse: Vear Clock, RN, Elease Hashimoto Date/Time Lamount Cohen Time): 11/23/2020 11:21:47 AM Confirm and document reason for call. If symptomatic, describe symptoms. ---Caller states that he was in the ER last night and he has some questions after that visit. He has been having migraine headaches and was seen at the ER a few times for these headaches. Last night he had a headache that woke him up and he looked up the symptoms and its the same as he had in the past. He had a hx of Mastoiditis a couple of years of ago. he said the s&S feel the same. Last night an MRI was done. The ER do said it was fine. He is having headache, ear ache, and no fever. Pain is moderate at times. He was given Reglan. He states he is having no stomach issues Does the patient have any new or worsening symptoms? ---Yes Will a triage be completed? ---Yes Related visit to physician within the last 2 weeks? ---Yes Does the PT have any chronic conditions? (i.e. diabetes, asthma, this includes High risk factors for pregnancy, etc.) ---No Is this a behavioral health or substance abuse call? ---No PLEASE NOTE: All timestamps contained within this report are represented as [place] Standard Time. CONFIDENTIALTY NOTICE: This fax transmission is intended only for the addressee. It contains information that is legally privileged, confidential or otherwise protected from use or disclosure. If you are not the intended recipient, you are strictly prohibited from reviewing, disclosing, copying using or disseminating any of this information or taking any action in reliance on or regarding this information. If you have received this fax in error, please notify us immediately by telephone so that we can arrange for its return to [place]. Phone: [phone], Toll-Free: [phone], Fax: [phone] Page: 2 of 2 Call Id: [id number] Guidelines Guideline Title Affirmed Question Affirmed Notes Nurse Date/Time Lamount Cohen Time) Headache  [1] SEVERE headache (e.g., excruciating) AND [2] "worst headache" of life Emiliano Dyer 11/23/2020 11:26:20 AM Disp. Time Lamount Cohen Time) Disposition Final User 11/23/2020 11:31:50 AM Go to ED Now (or PCP triage) Yes Vear Clock, RN, Ancil Boozer Disagree/Comply Disagree Caller Understands Yes PreDisposition Call Doctor Care Advice Given Per Guideline GO TO ED NOW (OR PCP TRIAGE): CARE ADVICE given per Headache (Adult) guideline. Comments User: Aviva Kluver, RN Date/Time Lamount Cohen Time): 11/23/2020 11:33:18 AM Pt has been to the ER for the same s&s and he feels like they are not addressing his needs. He was given a rx for Reglan but he has not had any upper or lower stomach issues Referrals GO TO FACILITY REFUSED

## 2020-11-23 NOTE — Telephone Encounter (Signed)
Pt called requesting clarification on CT scan results and sharing that he is still having a headache. Pt encouraged to make the appt to followup with the neurologist and pick up his rx today. Pt instructed he could return to the ED if he began to feel worse. Pt verbalized understanding.

## 2020-11-23 NOTE — Telephone Encounter (Signed)
Patient called stating that he spoke to someone about being in the ER. He stated that he was transferred to me and he isn't sure why he was sent to me. I can not find anything in the chart. Can a nurse call him at [phone]

## 2020-11-23 NOTE — Telephone Encounter (Signed)
Dr. Durene Cal, please review Triage message and advise anything further. Pt is scheduled to see Alyssa on 7/5.

## 2020-11-24 NOTE — Telephone Encounter (Signed)
I agree with an office visit needed.  He does have opacification of mastoid air cells-if he is not having redness, ear pain, swelling in this area I do not strongly suspect this is causing his pain.  We could certainly get ear nose and throat opinion.  Another idea with ongoing headaches is getting neurology opinion

## 2020-11-27 ENCOUNTER — Other Ambulatory Visit: Payer: Self-pay

## 2020-11-27 ENCOUNTER — Encounter: Payer: Self-pay | Admitting: Physician Assistant

## 2020-11-27 ENCOUNTER — Ambulatory Visit (INDEPENDENT_AMBULATORY_CARE_PROVIDER_SITE_OTHER): Payer: Self-pay | Admitting: Physician Assistant

## 2020-11-27 VITALS — BP 134/87 | HR 64 | Temp 98.1°F | Ht 76.0 in | Wt 208.4 lb

## 2020-11-27 DIAGNOSIS — H7493 Unspecified disorder of middle ear and mastoid, bilateral: Secondary | ICD-10-CM

## 2020-11-27 DIAGNOSIS — R519 Headache, unspecified: Secondary | ICD-10-CM

## 2020-11-27 MED ORDER — AMOXICILLIN-POT CLAVULANATE 875-125 MG PO TABS: 1.0000 | ORAL_TABLET | Freq: Two times a day (BID) | ORAL | 0 refills | Status: AC

## 2020-11-27 MED ORDER — IBUPROFEN 600 MG PO TABS: 600.0000 mg | ORAL_TABLET | Freq: Three times a day (TID) | ORAL | 0 refills | Status: DC | PRN

## 2020-11-27 NOTE — Telephone Encounter (Signed)
Noted, pt has office visit today with Alyssa.

## 2020-11-27 NOTE — Patient Instructions (Signed)
Please take the Augmentin as directed for possible infection. Ibuprofen with food as needed for pain. Someone will call about referral to ENT. If this does not seem to improve symptoms, we need to consider this being atypical migraines and referral to neurology at that time may be needed.

## 2020-11-27 NOTE — Progress Notes (Signed)
Acute Office Visit  Subjective:    Patient ID: Ross Ryan, male    DOB: 1980-08-13, 40 y.o.   MRN: [id number]  Chief Complaint  Patient presents with   Migraine    HPI Patient is in today for ED f/up on (?) migraines.   It is noted that patient was in the emergency department both on 11/15/2020 and 11/22/2020 for the same problem.  He describes it as a coming and going pain behind his right ear that goes somewhat down his neck and into the area behind his right eye.  He says the pain feels similar to when he had mastoiditis.  Emergency department did check a CT on the last visit and it was noted that he had bilateral opacity of the mastoids, but no acute findings.  He was told that he likely has migraines and may need to follow-up with neurology.  He was given Imitrex to take as needed for migraines and he says this does not seem to help.  Patient states that the pain was coming and going and very sharp this weekend, but currently seems to be doing okay.  He does not have a history of migraines.  There is no family history of migraines.   Past Medical History:  Diagnosis Date   Hypertension 12/2016    Past Surgical History:  Procedure Laterality Date   Fatty tumor removed from right Posterior forearm Right 2010   KNEE SURGERY      Family History  Problem Relation Age of Onset   Hypertension Mother    Diabetes Father    Hyperlipidemia Father    Hypertension Father    Healthy Sister    Healthy Brother    Dementia Maternal Grandmother    Other Maternal Grandfather        doesnt know cause of death   Heart disease Paternal Grandmother    Other Paternal Grandfather        doesnt know cause of death    Social History   Socioeconomic History   Marital status: Single    Spouse name: Not on file   Number of children: Not on file   Years of education: Not on file   Highest education level: Not on file  Occupational History   Not on file  Tobacco Use   Smoking  status: Never   Smokeless tobacco: Never   Tobacco comments:    smokes cigars socially- encouraged cessation  Vaping Use   Vaping Use: Never used  Substance and Sexual Activity   Alcohol use: Yes    Comment: social   Drug use: Yes    Types: Marijuana    Comment: daily   Sexual activity: Yes    Birth control/protection: Condom  Other Topics Concern   Not on file  Social History Narrative   Lives alone.       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      Social history: travel, time out with friends, beach   Social Determinants of Health   Financial Resource Strain: Not on file  Food Insecurity: Not on file  Transportation Needs: Not on file  Physical Activity: Not on file  Stress: Not on file  Social Connections: Not on file  Intimate Partner Violence: Not on file    Outpatient Medications Prior to Visit  Medication Sig Dispense Refill   omeprazole (PRILOSEC) 40 MG capsule TAKE 1 CAPSULE (  40 MG TOTAL) BY MOUTH ONCE DAILY BEFORE BREAKFAST 90 capsule 3   SUMAtriptan (IMITREX) 100 MG tablet Take 1 tablet (100 mg total) by mouth every 2 (two) hours as needed for migraine. May repeat in 2 hours if headache persists or recurs. 10 tablet 0   No facility-administered medications prior to visit.    No Known Allergies  Review of Systems REFER TO HPI FOR PERTINENT POSITIVES AND NEGATIVES     Objective:    Physical Exam Vitals and nursing note reviewed.  Constitutional:      General: He is not in acute distress.    Appearance: Normal appearance. He is normal weight. He is not ill-appearing.  HENT:     Head: Normocephalic and atraumatic.     Right Ear: Tympanic membrane and ear canal normal. There is mastoid tenderness.     Left Ear: Tympanic membrane and ear canal normal.     Nose: Nose normal.     Mouth/Throat:     Mouth: Mucous membranes are moist.  Eyes:     Pupils: Pupils are  equal, round, and reactive to light.  Cardiovascular:     Rate and Rhythm: Normal rate and regular rhythm.     Pulses: Normal pulses.     Heart sounds: No murmur heard. Pulmonary:     Effort: Pulmonary effort is normal.     Breath sounds: Normal breath sounds.  Neurological:     General: No focal deficit present.     Mental Status: He is alert and oriented to person, place, and time.  Psychiatric:        Mood and Affect: Mood normal.        Behavior: Behavior normal.    BP 134/87   Pulse 64   Temp 98.1 F (36.7 C)   Ht 6\' 4"  (1.93 m)   Wt 208 lb 6.1 oz (94.5 kg)   SpO2 99%   BMI 25.36 kg/m  Wt Readings from Last 3 Encounters:  11/27/20 208 lb 6.1 oz (94.5 kg)  11/22/20 210 lb (95.3 kg)  11/15/20 211 lb (95.7 kg)    Health Maintenance Due  Topic Date Due   Pneumococcal Vaccine 2-58 Years old (1 - PCV) Never done    There are no preventive care reminders to display for this patient.   Lab Results  Component Value Date   TSH 0.87 10/29/2020   Lab Results  Component Value Date   WBC 5.5 11/22/2020   HGB 12.8 (L) 11/22/2020   HCT 40.3 11/22/2020   MCV 82.6 11/22/2020   PLT 186 11/22/2020   Lab Results  Component Value Date   NA 142 11/22/2020   K 4.1 11/22/2020   CO2 26 11/22/2020   GLUCOSE 88 11/22/2020   BUN 12 11/22/2020   CREATININE 1.19 11/22/2020   BILITOT 0.6 11/22/2020   ALKPHOS 48 11/22/2020   AST 20 11/22/2020   ALT 27 11/22/2020   PROT 6.8 11/22/2020   ALBUMIN 3.9 11/22/2020   CALCIUM 9.2 11/22/2020   ANIONGAP 8 11/22/2020   GFR 81.93 10/29/2020   Lab Results  Component Value Date   CHOL 103 10/29/2020   Lab Results  Component Value Date   HDL 41.50 10/29/2020   Lab Results  Component Value Date   LDLCALC 50 10/29/2020   Lab Results  Component Value Date   TRIG 60.0 10/29/2020   Lab Results  Component Value Date   CHOLHDL 2 10/29/2020   Lab Results  Component Value Date  HGBA1C 4.7 01/05/2020       Assessment &  Plan:   Problem List Items Addressed This Visit   None Visit Diagnoses     Right-sided headache    -  Primary   Relevant Medications   ibuprofen (ADVIL) 600 MG tablet   Other Relevant Orders   Ambulatory referral to ENT   Disorder of both mastoids       Relevant Orders   Ambulatory referral to ENT        Meds ordered this encounter  Medications   amoxicillin-clavulanate (AUGMENTIN) 875-125 MG tablet    Sig: Take 1 tablet by mouth 2 (two) times daily for 7 days.    Dispense:  14 tablet    Refill:  0   ibuprofen (ADVIL) 600 MG tablet    Sig: Take 1 tablet (600 mg total) by mouth every 8 (eight) hours as needed.    Dispense:  30 tablet    Refill:  0   1. Right-sided headache 2. Disorder of both mastoids I personally reviewed both emergency department notes as well as his CT imaging.  I also discussed his case with Dr. Jimmey Ralph, as his PCP Dr. Durene Cal was out of the office today.  We both agree that as his symptoms feel similar to his previous experience with mastoiditis, we can try to treat with Augmentin and see if his symptoms resolve.  He should also discuss CT findings with ENT.  I also gave ibuprofen 600 mg to take as needed for pain and inflammation.  Should his symptoms persist despite this treatment, we will need to consider atypical migraines and neurology referral at that time.  Patient understands that he is to go back to the emergency department for any sudden worsening symptoms.  This note was prepared with assistance of Conservation officer, historic buildings. Occasional wrong-word or sound-a-like substitutions may have occurred due to the inherent limitations of voice recognition software.  Total encounter time including chart review, history and physical, plan and chart documentation was 37 minutes today.   Vihaan Gloss M Deira Shimer, PA-C

## 2020-12-04 ENCOUNTER — Other Ambulatory Visit: Payer: Self-pay

## 2020-12-04 ENCOUNTER — Encounter: Payer: Self-pay | Admitting: Physician Assistant

## 2020-12-04 MED ORDER — SUMATRIPTAN SUCCINATE 100 MG PO TABS: 100.0000 mg | ORAL_TABLET | ORAL | 0 refills | Status: DC | PRN

## 2020-12-17 ENCOUNTER — Other Ambulatory Visit: Payer: Self-pay | Admitting: Family Medicine

## 2020-12-17 ENCOUNTER — Other Ambulatory Visit: Payer: Self-pay | Admitting: Physician Assistant

## 2020-12-17 ENCOUNTER — Encounter: Payer: Self-pay | Admitting: Family Medicine

## 2020-12-17 ENCOUNTER — Encounter: Payer: Self-pay | Admitting: Physician Assistant

## 2020-12-18 ENCOUNTER — Encounter: Payer: Self-pay | Admitting: Physician Assistant

## 2020-12-18 ENCOUNTER — Other Ambulatory Visit: Payer: Self-pay

## 2020-12-18 ENCOUNTER — Other Ambulatory Visit: Payer: Self-pay | Admitting: Physician Assistant

## 2020-12-18 ENCOUNTER — Other Ambulatory Visit: Payer: Self-pay | Admitting: Family Medicine

## 2020-12-18 DIAGNOSIS — R519 Headache, unspecified: Secondary | ICD-10-CM

## 2020-12-18 MED ORDER — IBUPROFEN 600 MG PO TABS: 600.0000 mg | ORAL_TABLET | Freq: Three times a day (TID) | ORAL | 0 refills | Status: DC | PRN

## 2020-12-18 NOTE — Telephone Encounter (Signed)
See 2 mychart messages on this topic

## 2020-12-19 NOTE — Telephone Encounter (Signed)
Please call pt and schedule an appt with Dr. Durene Cal per him, can use same day slot for Headaches.

## 2020-12-19 NOTE — Telephone Encounter (Signed)
Patient has appt with neuro on 12/21/20.

## 2020-12-19 NOTE — Telephone Encounter (Signed)
Patient is scheduled for a Virtual visit on 12/31/20.

## 2020-12-19 NOTE — Telephone Encounter (Signed)
Patient is scheduled with [facility]. Does Patient still need appointment with you?

## 2020-12-19 NOTE — Telephone Encounter (Signed)
Patient is scheduled   

## 2020-12-20 NOTE — Progress Notes (Signed)
NEUROLOGY CONSULTATION NOTE  Ross Ryan MRN: [id number] DOB: 12/17/1980  Referring provider: Shelva Majestic, MD Primary care provider: Shelva Majestic, MD  Reason for consult:  headache  Assessment/Plan:   Cluster headache Start verapamil 80mg  three times daily.  If no improvement in 2 weeks, he is to contact me and we can increase dose to 80mg /80mg /160mg . For abortive therapy, will prescribe sumatriptan 20mg  NS.  He should not take with the tablet Recommended to abstain from alcohol and marijuana as they are triggers  Right upper extremity numbness - likely radiculopathy vs atypical carpal tunnel syndrome Advised to wear wrist splint Patient deferred NCV-EMG at this time  3.    Follow up 6 months.  Subjective:  Ross Ryan is a 40 year old right-handed male with HTN and depression who presents for headache.  History supplemented by ED and family medicine notes.  Started in June.  Severe shooting pain that starts at the right TMJ and travels up behind the right eye, temple and behind the ear.  Symptoms similar to when he previously had mastoiditis.  Took antibiotics which was ineffective..  Saw the dentist and underwent root canal which was ineffective.  He has associated blurred vision in right eye, right eye lacrimation, right-sided rhinorrhea, and photophobia.  No nausea, vomiting, ptosis, conjunctival injection, speech disturbance, numbness or weakness.  It lasts 1 hour with sumatriptan 100mg  tablet, otherwise up to 3 hours.  It occurs 1 to 2 times a day.  When he has it, he cannot keep still (paces, rocks back and forth).  Alcohol and marijuana smoke are triggers.  Nothing really relieves it.  He went to the ED on 11/15/2020 and 11/22/2020 for this.  CT head on 11/22/2020 personally reviewed showed bilateral opacification of the bilateral mastoids but no acute findings.  He was told that he likely has migraines.  He denies prior history of migraines.   Incidentally, he  reports numbness involving the third and fourth digits of his right hand radiating up his arm.  No associated pain or neck pain.  No weakness.    Current NSAIDS/analgesics:  ibuprofen 600mg  Current triptans:  sumatriptan 100mg  Current ergotamine:  none Current anti-emetic:  none Current muscle relaxants:  none Current Antihypertensive medications:  none Current Antidepressant medications:  none Current Anticonvulsant medications:  none Current anti-CGRP:  none Current Vitamins/Herbal/Supplements:  none Current Antihistamines/Decongestants:  none Other therapy:  none Hormone/birth control:  none   Past NSAIDS/analgesics:  meloxicam, naproxen Past abortive triptans:  none Past abortive ergotamine:  none Past muscle relaxants:  Flexeril, Robaxin Past anti-emetic:  Reglan Past antihypertensive medications:  amlodipine Past antidepressant medications:  fluoxetine, mirtazpine Past anticonvulsant medications:  none Past anti-CGRP:  none Past vitamins/Herbal/Supplements:  none Past antihistamines/decongestants:  Flonase Other past therapies:  none   PAST MEDICAL HISTORY: Past Medical History:  Diagnosis Date   Hypertension 12/2016    PAST SURGICAL HISTORY: Past Surgical History:  Procedure Laterality Date   Fatty tumor removed from right Posterior forearm Right 2010   KNEE SURGERY      MEDICATIONS: Current Outpatient Medications on File Prior to Visit  Medication Sig Dispense Refill   ibuprofen (ADVIL) 600 MG tablet Take 1 tablet (600 mg total) by mouth every 8 (eight) hours as needed. 30 tablet 0   omeprazole (PRILOSEC) 40 MG capsule TAKE 1 CAPSULE (40 MG TOTAL) BY MOUTH ONCE DAILY BEFORE BREAKFAST 90 capsule 3   SUMAtriptan (IMITREX) 100 MG tablet Take 1 tablet (100 mg  total) by mouth every 2 (two) hours as needed for migraine. May repeat in 2 hours if headache persists or recurs. 30 tablet 0   No current facility-administered medications on file prior to visit.     ALLERGIES: No Known Allergies  FAMILY HISTORY: Family History  Problem Relation Age of Onset   Hypertension Mother    Diabetes Father    Hyperlipidemia Father    Hypertension Father    Healthy Sister    Healthy Brother    Dementia Maternal Grandmother    Other Maternal Grandfather        doesnt know cause of death   Heart disease Paternal Grandmother    Other Paternal Grandfather        doesnt know cause of death    Objective:  Blood pressure (!) 148/78, pulse 65, height 6\' 4"  (1.93 m), weight 210 lb 6.4 oz (95.4 kg), SpO2 100 %. General: No acute distress.  Patient appears well-groomed.   Head:  Normocephalic/atraumatic Eyes:  fundi examined but not visualized Neck: supple, no paraspinal tenderness, full range of motion Back: No paraspinal tenderness Heart: regular rate and rhythm Lungs: Clear to auscultation bilaterally. Vascular: No carotid bruits. Neurological Exam: Mental status: alert and oriented to person, place, and time, recent and remote memory intact, fund of knowledge intact, attention and concentration intact, speech fluent and not dysarthric, language intact. Cranial nerves: CN I: not tested CN II: pupils equal, round and reactive to light, visual fields intact CN III, IV, VI:  full range of motion, no nystagmus, no ptosis CN V: facial sensation intact. CN VII: upper and lower face symmetric CN VIII: hearing intact CN IX, X: gag intact, uvula midline CN XI: sternocleidomastoid and trapezius muscles intact CN XII: tongue midline Bulk & Tone: normal, no fasciculations. Motor:  muscle strength 5/5 throughout Sensation:  Pinprick, temperature and vibratory sensation intact. Deep Tendon Reflexes:  2+ throughout,  toes downgoing.   Finger to nose testing:  Without dysmetria.   Heel to shin:  Without dysmetria.   Gait:  Normal station and stride.  Romberg negative.    Thank you for allowing me to take part in the care of this patient.  ,  DO  CC: Shon Millet, MD

## 2020-12-21 ENCOUNTER — Other Ambulatory Visit: Payer: Self-pay

## 2020-12-21 ENCOUNTER — Ambulatory Visit (INDEPENDENT_AMBULATORY_CARE_PROVIDER_SITE_OTHER): Payer: Self-pay | Admitting: Neurology

## 2020-12-21 ENCOUNTER — Encounter: Payer: Self-pay | Admitting: Neurology

## 2020-12-21 VITALS — BP 148/78 | HR 65 | Ht 76.0 in | Wt 210.4 lb

## 2020-12-21 DIAGNOSIS — R202 Paresthesia of skin: Secondary | ICD-10-CM

## 2020-12-21 DIAGNOSIS — G44009 Cluster headache syndrome, unspecified, not intractable: Secondary | ICD-10-CM

## 2020-12-21 DIAGNOSIS — R200 Anesthesia of skin: Secondary | ICD-10-CM

## 2020-12-21 MED ORDER — VERAPAMIL HCL 80 MG PO TABS: 80.0000 mg | ORAL_TABLET | Freq: Three times a day (TID) | ORAL | 5 refills | Status: DC

## 2020-12-21 MED ORDER — SUMATRIPTAN 20 MG/ACT NA SOLN: NASAL | 5 refills | Status: DC

## 2020-12-21 NOTE — Patient Instructions (Signed)
Start verapamil 80mg  three times daily.  If no improvement in 2 weeks, contact me At earliest onset of headache, use sumatriptan spray - 1 spray in one nostril - may repeat in 2 hours.  Maximum 2 sprays in 24 hours.  Do not take with the sumatriptan tablet Follow up 6 months.  Cluster Headache Cluster headaches hurt a lot. They normally happen on one side of your head, but they may switch sides. Often, cluster headaches: Cause a lot of pain. Happen for weeks to months. Last from 15 minutes to 3 hours. Happen at the same time each day. Happen at night. Happen many times a day. Happen more often in the fall and springtime. What are the causes? The exact cause is not known. They are not usually caused by foods, changes in body chemicals (hormonalchanges), or stress. What increases the risk? Being a male between the ages of 60-61 years old. Smoking or using products that contain nicotine or tobacco. Having elevated levels of body chemical called histamine. This can happen in people who have allergies. Taking certain medicines that cause blood vessels to expand. Having a parent or brother or sister who has cluster headaches. What are the signs or symptoms? Very bad pain on one side of the head that begins behind or around your eye but may spread to your face, head, and neck. Feeling like you may vomit (nauseous). Being sensitive to light. Runny nose and stuffy nose. Swelling of the forehead or face on the affected side. Eye problems. This might include a droopy or swollen eyelid, eye redness, or tearing on the affected side. Feeling restless or upset. Pale skin or a flushed face. How is this treated? Medicines. Oxygen that is breathed in through a mask. Follow these instructions at home: Headache diary Keep a headache diary as told by your doctor. Doing this can help you and your doctor figure out what triggers your headaches. In your headache diary, include information about: The  time of day that your headache started and what you were doing when it began. How long your headache lasted. Where your pain started and whether it moved to other areas. The type of pain. Your level of pain. Use a pain scale and rate the pain with a number from 1 (mild) up to 10 (very bad). The treatment that you used, and any change in symptoms after treatment.  Medicines Take over-the-counter and prescription medicines only as told by your health care provider. Ask your doctor if the medicine prescribed to you: Requires you to avoid driving or using machinery. Can cause trouble pooping (constipation). You may need to take these actions to prevent or treat trouble pooping: Drink enough fluid to keep your pee (urine) pale yellow. Take over-the-counter or prescription medicines. Eat foods that are high in fiber. These include beans, whole grains, and fresh fruits and vegetables. Limit foods that are high in fat and processed sugars. These include fried or sweet foods. Lifestyle  Go to bed at the same time each night. Get the same amount of sleep every night. Get 7-9 hours of sleep each night, or the amount recommended by your doctor. Limit or manage stress. Exercise regularly. Exercise for at least 30 minutes, 5 times each week. Eat a healthy diet. Avoid any foods that you know may trigger your headaches. Do not drink alcohol. Do not use any products that contain nicotine or tobacco, such as cigarettes, e-cigarettes, and chewing tobacco. If you need help quitting, ask your doctor.  General  instructions Use oxygen as told by your doctor. Keep all follow-up visits as told by your health care provider. This is important. Contact a doctor if: Your headaches: Change. Get worse. Happen more often. Your medicines or oxygen are not helping. Get help right away if: You faint. You get weak or lose feeling (have numbness) on one side of your body or face. You see two of everything (double  vision). You feel you may vomit or you vomit and it does stop after many hours. You have trouble with your balance or with walking. You have trouble talking. You have neck pain or stiffness and you have a fever. Summary Cluster headaches hurt a lot. Keep a headache diary. Do not drink alcohol. Medicines and oxygen may help you feel better. This information is not intended to replace advice given to you by your health care provider. Make sure you discuss any questions you have with your healthcare provider. Document Revised: 01/13/2020 Document Reviewed: 06/16/2019 Elsevier Patient Education  2022 [employer].

## 2020-12-24 ENCOUNTER — Other Ambulatory Visit: Payer: Self-pay | Admitting: Neurology

## 2020-12-24 ENCOUNTER — Encounter: Payer: Self-pay | Admitting: Family Medicine

## 2020-12-24 MED ORDER — SUMATRIPTAN SUCCINATE 100 MG PO TABS: ORAL_TABLET | ORAL | 5 refills | Status: DC

## 2020-12-31 ENCOUNTER — Encounter: Payer: Self-pay | Admitting: Family Medicine

## 2020-12-31 ENCOUNTER — Telehealth: Payer: Self-pay | Admitting: Physician Assistant

## 2020-12-31 ENCOUNTER — Telehealth (INDEPENDENT_AMBULATORY_CARE_PROVIDER_SITE_OTHER): Payer: Self-pay | Admitting: Family Medicine

## 2020-12-31 DIAGNOSIS — G44019 Episodic cluster headache, not intractable: Secondary | ICD-10-CM

## 2020-12-31 MED ORDER — PREDNISONE 50 MG PO TABS: ORAL_TABLET | ORAL | 0 refills | Status: DC

## 2020-12-31 NOTE — Progress Notes (Signed)
Phone [phone] Virtual visit via Video note   Subjective:  Chief complaint: Chief Complaint  Patient presents with   Migraine    Patient states that this is an ongoing thing, States that he has been getting these headaches / migraines everyday. Wants to discuss other solutions.    Back Pain    Lower back but closer to the middle. When he bends back he can feel the pain    This visit type was conducted due to national recommendations for restrictions regarding the COVID-19 Pandemic (e.g. social distancing).  This format is felt to be most appropriate for this patient at this time balancing risks to patient and risks to population by having him in for in person visit.  No physical exam was performed (except for noted visual exam or audio findings with Telehealth visits).    Our team/I connected with Georgian Co at  2:40 PM EDT by a video enabled telemedicine application (doxy.me or caregility through epic) and verified that I am speaking with the correct person using two identifiers.  Location patient: [facility] Location provider: [facility], office Persons participating in the virtual visit:  patient  Our team/I discussed the limitations of evaluation and management by telemedicine and the availability of in person appointments. In light of current covid-19 pandemic, patient also understands that we are trying to protect them by minimizing in office contact if at all possible.  The patient expressed consent for telemedicine visit and agreed to proceed. Patient understands insurance will be billed.   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 30 tablet 0   omeprazole (PRILOSEC) 40 MG capsule TAKE 1 CAPSULE (40 MG TOTAL) BY MOUTH ONCE DAILY BEFORE BREAKFAST 90 capsule 3   predniSONE (DELTASONE) 50 MG tablet Take 1 tablet for 5 days then 1/2 tablet for 6 days then stop 8 tablet 0   SUMAtriptan (IMITREX) 100 MG tablet Take 1 tablet earliest onset of headache.  May repeat in 2 hours if headache persists or recurs.  Maximum 2 tablets in 24 hours. 10 tablet 5   verapamil (CALAN) 80 MG tablet Take 1 tablet (80 mg total) by mouth 3 (three) times daily. 90 tablet 5   No current facility-administered medications for this visit.     Objective:  no self reported vitals Gen: NAD, resting comfortably Lungs: nonlabored, normal respiratory rate  Skin: appears dry, no obvious rash     Assessment and Plan      #Right-sided cluster headaches S:patient with ER visit 11/15/20- had headaches starting around 20th or June all on the right side of his face. Runny nose all on the right side. Eye will run  like he is crying. He is sensitive to light and has to be in a dark room. Starts in teeth and upper jaw with pain up into his scalp. First visit thought could be a migraine- was discharged after dexamethasone and ketorolac injection  Went back on 11/22/20 and had a few days without headaches but then came back. No history of chronic headches or migraines noted. [place] up to 10/10. Neuro exam and CT of head reassuring at that time. No obvious mass, bleeding, or emergent pathology noted. [place]land helped with pain. ER physiciaosis or  arterial disasdfn did consider issues such as vascular pathology sudis dural venous sinus thrombosis or arterial dissection but thought less likely. Gave sumatriptan and discharged patient.   Patient reports sumatriptan helps a fair amount. 600 ibuprofen does not help. Using sumatriptan as needed- one each night last week (we discussed risk of rebound). Will take 2 a day if headaches recur. Never more than 2 a day.    Patient started verapamil 3x a day- has not noted a difference but still getting regular headaches. Last night had one and came again this morning.   Smoked cigars prior to this starting once a day. No other smoking. No history of head trauma A/P: Patient with right-sided cluster headaches.  These respond to sumatriptan but he is needing this on a daily or twice daily basis.  He does not have insurance at present and will be difficult to obtain oxygen therapy to try-there are some over-the-counter option therapies like boost oxygen which I think would be low risk and reasonable to try to see if this would help him to abort headaches.  He can try this before taking sumatriptan for up to 15 minutes.  From a preventative side-he was started on verapamil by neurology-unfortunately due to insurance issues expensive for him to return there and he would like for me to assume care.  He has not yet been on verapamil 80 mg 3 times a day for 10 days-we are going to "boost this" with prednisone as noted on suggestions by up-to-date.  At day 10 can increase total daily dose by approximately 80 mg-he will reach out when he reaches this point. - I also need to check in on his blood pressure at that point-was poorly controlled at Dr. Carolan Shiver does not appear to be taking amlodipine a anymore  We discussed possible imaging with MR angiogram of head and neck but he does not have insurance and may be cost prohibitive-apparently he has someone that may be able to help him with cost-may be a co-op and  he is going to check in on potentially getting this covered   I want patient to be in close contact through mychart so we can work through this- this has really been distressing for him  Recommended follow up:  Future Appointments  Date Time Provider Department Center  05/03/2021  8:00 AM Shelva Majestic, MD [facility]  06/25/2021  8:30 AM Drema Dallas, DO [facility] None    Lab/Order associations:   ICD-10-CM   1. Episodic cluster headache, not intractable  G44.019       Meds ordered this encounter  Medications   predniSONE (DELTASONE) 50 MG tablet    Sig: Take 1 tablet for 5 days then 1/2 tablet for 6 days then stop    Dispense:  8 tablet    Refill:  0   Return precautions advised.  Tana Conch, MD

## 2020-12-31 NOTE — Patient Instructions (Addendum)
   Recommended follow up: No follow-ups on file.  

## 2021-04-26 NOTE — Progress Notes (Signed)
Phone [phone] In person visit   Subjective:   Ross Ryan is a 40 y.o. year old very pleasant male patient who presents for/with See problem oriented charting Chief Complaint  Patient presents with   Follow-up   Hypertension   Depression    This visit occurred during the SARS-CoV-2 public health emergency.  Safety protocols were in place, including screening questions prior to the visit, additional usage of staff PPE, and extensive cleaning of exam room while observing appropriate contact time as indicated for disinfecting solutions.   Past Medical History-  Patient Active Problem List   Diagnosis Date Noted   Cluster headache 05/03/2021    Priority: Medium    GAD (generalized anxiety disorder) 05/03/2021    Priority: Medium    Gastroesophageal reflux disease without esophagitis 11/02/2018    Priority: Medium    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/05/2018    Priority: Medium    Hypertension 07/05/2018    Priority: Medium    Throat irritation 07/05/2018    Priority: Low   Weight loss 07/05/2018    Priority: Low   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[facility]  04/02/2017    Priority: Low   Vitamin B12 deficiency - oral repletion  02/02/2017    Priority: Low   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     Objective:  BP 120/70   Pulse 71   Temp 98.3 F (36.8 C)   Ht 6\' 4"  (1.93 m)   Wt 219 lb 9.6 oz (99.6 kg)   SpO2 99%   BMI 26.73 kg/m  Gen: NAD, resting comfortably CV: RRR no murmurs rubs or gallops Lungs: CTAB no crackles, wheeze, rhonchi Ext: no edema Skin: warm, dry    Assessment and Plan   #Right-sided cluster  headaches S:patient with ER visit 11/15/20- had headaches that started around 20th or June all on the right side of his face. Runny nose all on the right side. Eye will run like he was crying. He was sensitive to light and had to be in a dark room. Started in teeth and upper jaw with pain up into his scalp. First visit thought could be a migraine- was discharged after dexamethasone and ketorolac injection   Went back on 11/22/20 and had a few days without headaches but then came back. No history of chronic headches or migraines noted.  He was ultimately diagnosed with right-sided cluster headaches.  These responded to sumatriptan and but was needing this on a daily basis or twice daily basis.  He was uninsured at that time and options were limited on obtaining home oxygen-we discussed some over-the-counter options such as boost oxygen which I thought would be low risk. -At that time-we discussed possible imaging with MR angiogram of head and neck but he did not have insurance and may be cost prohibitive-apparently he has someone that may be able to help him with cost-may be a co-op and he is going to check in on potentially getting this covered.  -He had also seen neurology and was started on verapamil for prevention 80 mg 3 times a day-there were some suggestions on up-to-date about prednisone course which we trialed  Today  patient reports,oxygen was very helpful but thankfully not having more issues since September.   -He is no longer taking verapamil or sumatriptan A/P: Thrilled he is no longer having issues-the certainly sounds like cluster headaches and they responded appropriately to oxygen therapy-he plans to retry that if recurs-he is off of verapamil and not needing sumatriptan at this time thankfully.  # Essential Hypertension S: Medication: None  Home readings #s: 120s/70s if not anxious- sometime higher with anxiety BP Readings from Last 3 Encounters:  05/03/21 120/70  12/21/20 (!) 148/78   11/27/20 134/87  A/P: Thankfully controlled without medication-continue to monitor  # Depression S: Medication:none - full remission 6/22 visit   Depression screen [facility] 2/9 05/03/2021 10/29/2020 09/04/2020  Decreased Interest 0 0 0  Down, Depressed, Hopeless 0 0 0  PHQ - 2 Score 0 0 0  Altered sleeping 1 2 0  Tired, decreased energy 0 0 0  Change in appetite 0 0 0  Feeling bad or failure about yourself  0 0 0  Trouble concentrating 0 0 0  Moving slowly or fidgety/restless 0 0 0  Suicidal thoughts 0 0 0  PHQ-9 Score 1 2 0  Difficult doing work/chores Not difficult at all Not difficult at all Not difficult at all  A/P: Remains in formation without medication-continue without medication  #Anxiety  S: Patient had been feeling more anxious and irritable in prior visits- GAD 7 elevated above 10 on  06/22 visit. Referral to behavioral health with [place]-information given in past- has not seen them yet.   Patient reports although not dealing with any depression continues to struggle with anxiety at times-also notes blood pressure higher when this happens but well controlled today when anxiety is not high A/P: This certainly sounds like GAD-referral to behavioral health was placed today.  He would prefer this over medication  #GERD  S:Medication: omeprazole 40 mg   2 weeks ago  stopped having a lot of gurgling in stomach - he has continue omeprazole. Perhaps lasted a month prior to that. Not sure what caused it. Occasionally sharp pains in lower abdomen. Regular BMs. Mild nausea. No BRBPR or melena. Did have some hemorrhoid pain then but that resolved- was not straining.   A/P:  reasonable control- unclear what caused his abdominal gurgling and lower abdominal pain- does not sound like constipation. If recurrent asked him to return to see [place]. Could also try a probiotic if recurs like align or florastor. Definitely see [place] back if blood in stool or dark black stool.    #Cervical  lymphadenopathy S: In posterior cervical chain patient has had an enlarged lymph node around 1 cm for at least 3 months.  He has not felt ill overall during this time or had recent illness such as upper respiratory infection. A/P: With persistent lymph node enlargement recommend ENT referral-patient does not have insurance at the moment and would prefer for this to be done when he has insurance which she will have in January so he will reach out around January 1 and we will get him set up for this     Recommended follow up: No follow-ups on file. Future Appointments  Date Time Provider Department Center  06/25/2021  8:30 AM Shon Millet R, DO [facility] None    Lab/Order associations:   ICD-10-CM   1. Hypertension, unspecified type  I10     2. Recurrent major depressive disorder, in full remission (HCC)  F33.42     3. Gastroesophageal reflux disease without  esophagitis  K21.9     4. Episodic cluster headache, not intractable  G44.019     5. GAD (generalized anxiety disorder)  F41.1 Ambulatory referral to Psychology     No orders of the defined types were placed in this encounter.  I,Jada Bradford,acting as a scribe for Tana Conch, MD.,have documented all relevant documentation on the behalf of Tana Conch, MD,as directed by  Tana Conch, MD while in the presence of Tana Conch, MD.  I, Tana Conch, MD, have reviewed all documentation for this visit. The documentation on 05/03/21 for the exam, diagnosis, procedures, and orders are all accurate and complete.   Return precautions advised.  Tana Conch, MD

## 2021-05-03 ENCOUNTER — Encounter: Payer: Self-pay | Admitting: Family Medicine

## 2021-05-03 ENCOUNTER — Ambulatory Visit (INDEPENDENT_AMBULATORY_CARE_PROVIDER_SITE_OTHER): Payer: Self-pay | Admitting: Family Medicine

## 2021-05-03 ENCOUNTER — Other Ambulatory Visit: Payer: Self-pay

## 2021-05-03 VITALS — BP 120/70 | HR 71 | Temp 98.3°F | Ht 76.0 in | Wt 219.6 lb

## 2021-05-03 DIAGNOSIS — R590 Localized enlarged lymph nodes: Secondary | ICD-10-CM

## 2021-05-03 DIAGNOSIS — F419 Anxiety disorder, unspecified: Secondary | ICD-10-CM

## 2021-05-03 DIAGNOSIS — G44019 Episodic cluster headache, not intractable: Secondary | ICD-10-CM

## 2021-05-03 DIAGNOSIS — K219 Gastro-esophageal reflux disease without esophagitis: Secondary | ICD-10-CM

## 2021-05-03 DIAGNOSIS — F3342 Major depressive disorder, recurrent, in full remission: Secondary | ICD-10-CM

## 2021-05-03 DIAGNOSIS — I10 Essential (primary) hypertension: Secondary | ICD-10-CM

## 2021-05-03 DIAGNOSIS — F411 Generalized anxiety disorder: Secondary | ICD-10-CM

## 2021-05-03 DIAGNOSIS — Z8616 Personal history of COVID-19: Secondary | ICD-10-CM

## 2021-05-03 DIAGNOSIS — G44009 Cluster headache syndrome, unspecified, not intractable: Secondary | ICD-10-CM | POA: Insufficient documentation

## 2021-05-03 LAB — CBC WITH DIFFERENTIAL/PLATELET
Basophils Absolute: 0 10*3/uL (ref 0.0–0.1)
Basophils Relative: 0.6 % (ref 0.0–3.0)
Eosinophils Absolute: 0 10*3/uL (ref 0.0–0.7)
Eosinophils Relative: 1.2 % (ref 0.0–5.0)
HCT: 40.5 % (ref 39.0–52.0)
Hemoglobin: 13 g/dL (ref 13.0–17.0)
Lymphocytes Relative: 44.8 % (ref 12.0–46.0)
Lymphs Abs: 1.8 10*3/uL (ref 0.7–4.0)
MCHC: 32.1 g/dL (ref 30.0–36.0)
MCV: 82.2 fl (ref 78.0–100.0)
Monocytes Absolute: 0.5 10*3/uL (ref 0.1–1.0)
Monocytes Relative: 12.3 % — ABNORMAL HIGH (ref 3.0–12.0)
Neutro Abs: 1.6 10*3/uL (ref 1.4–7.7)
Neutrophils Relative %: 41.1 % — ABNORMAL LOW (ref 43.0–77.0)
Platelets: 190 10*3/uL (ref 150.0–400.0)
RBC: 4.93 Mil/uL (ref 4.22–5.81)
RDW: 13.8 % (ref 11.5–15.5)
WBC: 3.9 10*3/uL — ABNORMAL LOW (ref 4.0–10.5)

## 2021-05-03 LAB — COMPREHENSIVE METABOLIC PANEL
ALT: 21 U/L (ref 0–53)
AST: 20 U/L (ref 0–37)
Albumin: 4.3 g/dL (ref 3.5–5.2)
Alkaline Phosphatase: 60 U/L (ref 39–117)
BUN: 14 mg/dL (ref 6–23)
CO2: 29 mEq/L (ref 19–32)
Calcium: 9.5 mg/dL (ref 8.4–10.5)
Chloride: 105 mEq/L (ref 96–112)
Creatinine, Ser: 1.27 mg/dL (ref 0.40–1.50)
GFR: 71.71 mL/min (ref >60.00)
Glucose, Bld: 82 mg/dL (ref 70–99)
Potassium: 4.1 mEq/L (ref 3.5–5.1)
Sodium: 140 mEq/L (ref 135–145)
Total Bilirubin: 0.4 mg/dL (ref 0.2–1.2)
Total Protein: 7.4 g/dL (ref 6.0–8.3)

## 2021-05-03 NOTE — Patient Instructions (Addendum)
Please contact me January 1st, 2023 to have your referral with ENT placed  recommend getting your covid bivalent booster shot at your local pharmacy. If received, please let us know.   Please call [phone] to schedule a visit with [facility] - please tell the office you were directly referred by Dr. Durene Cal  Please consider [email or web address]  Recommended follow up: Return in about 6 months (around 11/01/2021) for physical or sooner if needed.  Happy Holidays!

## 2021-05-03 NOTE — Assessment & Plan Note (Signed)
#  Right-sided cluster headaches S:patient with ER visit 11/15/20- had headaches that started around 20th or June all on the right side of his face. Runny nose all on the right side. Eye will run like he was crying. He was sensitive to light and had to be in a dark room. Started in teeth and upper jaw with pain up into his scalp. First visit thought could be a migraine- was discharged after dexamethasone and ketorolac injection   Went back on 11/22/20 and had a few days without headaches but then came back. No history of chronic headches or migraines noted.  He was ultimately diagnosed with right-sided cluster headaches.  These responded to sumatriptan and but was needing this on a daily basis or twice daily basis.  He was uninsured at that time and options were limited on obtaining home oxygen-we discussed some over-the-counter options such as boost oxygen which I thought would be low risk. -At that time-we discussed possible imaging with MR angiogram of head and neck but he did not have insurance and may be cost prohibitive-apparently he has someone that may be able to help him with cost-may be a co-op and he is going to check in on potentially getting this covered.  -He had also seen neurology and was started on verapamil for prevention 80 mg 3 times a day-there were some suggestions on up-to-date about prednisone course which we trialed  Today patient reports,oxygen was very helpful but thankfully not having more issues since September.   -He is no longer taking verapamil or sumatriptan A/P: Thrilled he is no longer having issues-the certainly sounds like cluster headaches and they responded appropriately to oxygen therapy-he plans to retry that if recurs-he is off of verapamil and not needing sumatriptan at this time thankfully.

## 2021-05-03 NOTE — Addendum Note (Signed)
Addended by: Shelva Majestic on: 05/03/2021 08:41 AM   Modules accepted: Orders

## 2021-06-24 NOTE — Progress Notes (Signed)
Virtual Visit via Video Note The purpose of this virtual visit is to provide medical care while limiting exposure to the novel coronavirus.    Consent was obtained for video visit:  Yes.   Answered questions that patient had about telehealth interaction:  Yes.   I discussed the limitations, risks, security and privacy concerns of performing an evaluation and management service by telemedicine. I also discussed with the patient that there may be a patient responsible charge related to this service. The patient expressed understanding and agreed to proceed.  Pt location: Home Physician Location: office Name of referring provider:  Marin Olp, MD I connected with Desmond Dike at patients initiation/request on 06/25/2021 at  8:30 AM EST by video enabled telemedicine application and verified that I am speaking with the correct person using two identifiers. Pt MRN:  [id number] Pt DOB:  1980-06-21 Video Participants:  Desmond Dike  Assessment and Plan:   Right sided headache - semiology seemed consistent with cluster headache.  Headaches resolved after repeat dental work.  However, given the associated autonomic symptoms, cluster headache still possible and his cluster may have just resolved.  He did note improvement with verapamil   Will not scheduled routine follow up.  If he is to have a recurrence of pain, he is encouraged to follow up.  We can try prolonged prednisone taper and possibly restart verapamil if needed   History of Present Illness:  Ross Ryan is a 41 year old right-handed male with HTN and depression who follows up for cluster headache.  UPDATE: Started verapamil last visit, which helped.  However, he followed up with his dentist who performed more work and headaches resolved.  This was several months ago. He did think that the verapamil helped lessen the pain.  He is no longer on the verapamil  Frequency of abortive medication: none Current  NSAIDS/analgesics:  ibuprofen 600mg  Current triptans:  none Current ergotamine:  none Current anti-emetic:  none Current muscle relaxants:  none Current Antihypertensive medications:  none Current Antidepressant medications:  none Current Anticonvulsant medications:  none Current anti-CGRP:  none Current Vitamins/Herbal/Supplements:  none Current Antihistamines/Decongestants:  none Other therapy:  none Hormone/birth control:  none   HISTORY:  Started in June 2022.  Severe shooting pain that starts at the right TMJ and travels up behind the right eye, temple and behind the ear.  Symptoms similar to when he previously had mastoiditis.  Took antibiotics which was ineffective..  Saw the dentist and underwent root canal which was ineffective.  He has associated blurred vision in right eye, right eye lacrimation, right-sided rhinorrhea, and photophobia.  No nausea, vomiting, ptosis, conjunctival injection, speech disturbance, numbness or weakness.  It lasts 1 hour with sumatriptan 100mg  tablet, otherwise up to 3 hours.  It occurs 1 to 2 times a day.  When he has it, he cannot keep still (paces, rocks back and forth).  Alcohol and marijuana smoke are triggers.  Nothing really relieves it.  He went to the ED on 11/15/2020 and 11/22/2020 for this.  CT head on 11/22/2020 personally reviewed showed bilateral opacification of the bilateral mastoids but no acute findings.  He was told that he likely has migraines.  He denies prior history of migraines.   Past NSAIDS/analgesics:  meloxicam, naproxen Past abortive triptans:  sumatriptan 100mg  Past abortive ergotamine:  none Past muscle relaxants:  Flexeril, Robaxin Past anti-emetic:  Reglan Past antihypertensive medications:  verapamil (helped), amlodipine Past antidepressant medications:  fluoxetine, mirtazpine  Past anticonvulsant medications:  none Past anti-CGRP:  none Past vitamins/Herbal/Supplements:  none Past antihistamines/decongestants:   Flonase Other past therapies:  none  Past Medical History: Past Medical History:  Diagnosis Date   Hypertension 12/2016    Medications: Outpatient Encounter Medications as of 06/25/2021  Medication Sig   omeprazole (PRILOSEC) 40 MG capsule TAKE 1 CAPSULE (40 MG TOTAL) BY MOUTH ONCE DAILY BEFORE BREAKFAST   No facility-administered encounter medications on file as of 06/25/2021.    Allergies: No Known Allergies  Family History: Family History  Problem Relation Age of Onset   Hypertension Mother    Diabetes Father    Hyperlipidemia Father    Hypertension Father    Healthy Sister    Healthy Brother    Dementia Maternal Grandmother    Other Maternal Grandfather        doesnt know cause of death   Heart disease Paternal Grandmother    Other Paternal Grandfather        doesnt know cause of death    Observations/Objective:   No acute distress.  Alert and oriented.  Speech fluent and not dysarthric.  Language intact.    Follow Up Instructions:    -I discussed the assessment and treatment plan with the patient. The patient was provided an opportunity to ask questions and all were answered. The patient agreed with the plan and demonstrated an understanding of the instructions.   The patient was advised to call back or seek an in-person evaluation if the symptoms worsen or if the condition fails to improve as anticipated.    Dudley Major, DO

## 2021-06-25 ENCOUNTER — Telehealth (INDEPENDENT_AMBULATORY_CARE_PROVIDER_SITE_OTHER): Payer: Self-pay | Admitting: Neurology

## 2021-06-25 ENCOUNTER — Encounter: Payer: Self-pay | Admitting: Neurology

## 2021-06-25 ENCOUNTER — Other Ambulatory Visit: Payer: Self-pay

## 2021-06-25 VITALS — Ht 76.0 in | Wt 225.0 lb

## 2021-06-25 DIAGNOSIS — R519 Headache, unspecified: Secondary | ICD-10-CM

## 2021-08-01 ENCOUNTER — Encounter: Payer: Self-pay | Admitting: Family Medicine

## 2021-08-01 ENCOUNTER — Encounter (HOSPITAL_BASED_OUTPATIENT_CLINIC_OR_DEPARTMENT_OTHER): Payer: Self-pay

## 2021-08-01 ENCOUNTER — Emergency Department (HOSPITAL_BASED_OUTPATIENT_CLINIC_OR_DEPARTMENT_OTHER)
Admission: EM | Admit: 2021-08-01 | Discharge: 2021-08-01 | Disposition: A | Discharge status: Home / Self Care | Payer: Self-pay | Attending: Emergency Medicine | Admitting: Emergency Medicine

## 2021-08-01 ENCOUNTER — Ambulatory Visit (INDEPENDENT_AMBULATORY_CARE_PROVIDER_SITE_OTHER): Payer: Self-pay | Admitting: Family Medicine

## 2021-08-01 ENCOUNTER — Other Ambulatory Visit: Payer: Self-pay

## 2021-08-01 VITALS — BP 136/78 | HR 69 | Temp 98.2°F | Ht 76.0 in | Wt 219.0 lb

## 2021-08-01 DIAGNOSIS — R739 Hyperglycemia, unspecified: Secondary | ICD-10-CM

## 2021-08-01 DIAGNOSIS — I10 Essential (primary) hypertension: Secondary | ICD-10-CM | POA: Insufficient documentation

## 2021-08-01 DIAGNOSIS — R3589 Other polyuria: Secondary | ICD-10-CM

## 2021-08-01 LAB — POCT URINALYSIS DIPSTICK
Bilirubin, UA: NEGATIVE
Blood, UA: NEGATIVE
Glucose, UA: NEGATIVE
Ketones, UA: NEGATIVE
Leukocytes, UA: NEGATIVE
Nitrite, UA: NEGATIVE
Protein, UA: NEGATIVE
Spec Grav, UA: 1.01 (ref 1.010–1.025)
Urobilinogen, UA: 0.2 E.U./dL
pH, UA: 6 (ref 5.0–8.0)

## 2021-08-01 MED ORDER — AMLODIPINE BESYLATE 5 MG PO TABS: 5.0000 mg | ORAL_TABLET | Freq: Every day | ORAL | 2 refills | Status: DC

## 2021-08-01 MED ORDER — AMLODIPINE BESYLATE 5 MG PO TABS
5.0000 mg | ORAL_TABLET | Freq: Once | ORAL | Status: AC
  Administered 2021-08-01: 13:00:00 5 mg via ORAL
  Filled 2021-08-01: qty 1

## 2021-08-01 NOTE — ED Notes (Signed)
Pt NAD, a/ox4. Pt verbalizes understanding of all DC and f/u instructions. All questions answered. Pt walks with steady gait to lobby at DC.  ? ?

## 2021-08-01 NOTE — Progress Notes (Signed)
?Phone [phone] ?In person visit ?  ?Subjective:  ? ?Ross Ryan is a 41 y.o. year old very pleasant male patient who presents for/with See problem oriented charting ?Chief Complaint  ?Patient presents with  ? Hypertension  ?  Pt states that BP has been elevated. He states going to the ED today, and was given Amlodipine 5 mg tab. He hasn't started yet. He states that he works out regularly and eats healthy. She denies headaches, chest pain, SOB, or leg swelling.   ? Urinary Frequency  ?  He complains of foul urine odor as well, he noticed on and off 1-2 months ago. He denies burning, and hematuria.   ? Back Pain  ?  Lower  ? ?This visit occurred during the SARS-CoV-2 public health emergency.  Safety protocols were in place, including screening questions prior to the visit, additional usage of staff PPE, and extensive cleaning of exam room while observing appropriate contact time as indicated for disinfecting solutions.  ? ?Past Medical History-  ?Patient Active Problem List  ? Diagnosis Date Noted  ? Cluster headache 05/03/2021  ?  Priority: Medium   ? GAD (generalized anxiety disorder) 05/03/2021  ?  Priority: Medium   ? Gastroesophageal reflux disease without esophagitis 11/02/2018  ?  Priority: Medium   ? 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/05/2018  ?  Priority: Medium   ? Hypertension 07/05/2018  ?  Priority: Medium   ? Throat irritation 07/05/2018  ?  Priority: Low  ? Weight loss 07/05/2018  ?  Priority: Low  ? 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[facility]  04/02/2017  ?  Priority: Low  ? Vitamin B12 deficiency - oral repletion  02/02/2017  ?  Priority: Low  ? 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  Priority: Low  ? ? ?Medications- reviewed and updated ?Current Outpatient Medications  ?Medication Sig Dispense Refill  ? omeprazole (PRILOSEC) 40 MG capsule TAKE 1 CAPSULE (40 MG TOTAL) BY MOUTH ONCE DAILY BEFORE BREAKFAST 90 capsule 3  ? amLODipine (NORVASC) 5 MG tablet  Take 1 tablet (5 mg total) by mouth daily. (Patient not taking: Reported on 08/01/2021) 30 tablet 2  ? ?No current facility-administered medications for this visit.  ? ?  ?Objective:  ?BP 136/78   Pulse 69   Temp 98.2 ?F (36.8 ?C) (Temporal)   Ht 6\' 4"  (1.93 m)   Wt 219 lb (99.3 kg)   SpO2 100%   BMI 26.66 kg/m?  ?Gen: NAD, resting comfortably ?CV: RRR no murmurs rubs or gallops ?Lungs: CTAB no crackles, wheeze, rhonchi ?Ext: no edema ?Skin: warm, dry ?Results for orders placed or performed in visit on 08/01/21 (from the past 24 hour(s))  ?POCT Urinalysis Dipstick     Status: Normal  ? Collection Time: 08/01/21  3:25 PM  ?Result Value Ref Range  ? Color, UA yellow   ? Clarity, UA clear   ? Glucose, UA Negative Negative  ? Bilirubin, UA negative   ? Ketones, UA negative   ? Spec Grav, UA 1.010 1.010 - 1.025  ? Blood, UA negative   ? pH, UA 6.0 5.0 - 8.0  ? Protein, UA Negative Negative  ? Urobilinogen, UA 0.2 0.2 or 1.0 E.U./dL  ? Nitrite, UA negative   ? Leukocytes, UA Negative Negative  ? Appearance    ? Odor    ? ? ?  ? ?Assessment and Plan  ? ?#hypertension ?S: medication: none ?Home readings #s: sent home readings  for last 24 hours earlier today  ?183/96 7:19pm ?161/91 7:21pm ?162/93 7:23pm ?144/92 8:37pm ?165/95 10:45am  ?Had some fluttering in chest yesterday so decided to check BP- symptoms better now ? ?Had been running 120s or 30s/70s previously ?This morning 158/95 on recheck - sent [place] message nandwent to ED- rx for amlodipine 5 mg this morning. EKG done but no labs ? ?He has tried to improve diet- stopped eating fried foods, more fruits, veggies, working out regularly so he was surprised by home elevations ?BP Readings from Last 3 Encounters:  ?08/01/21 136/78  ?08/01/21 140/88  ?05/03/21 120/70  ?A/P: Reasonable control on repeat today-discussed how anxiety can certainly factor into elevated blood pressures-instructed him on how to check his blood pressure when resting for a minimum of 5 minutes with  mind clear.  He would prefer to hold off on taking amlodipine consistently at the moment.  Wants to continue to work on diet and lifestyle ? ?We opted to do the following "Reasonable to recheck blood pressure if running high- rest for 5 minuts and think peaceful thoughts then check 2 more times and take average of last 2 and consider that your blood pressure. Goal at home >135/85 ? ?You can pick up amlodipine and take it if blood pressure >155/95 and if regularly needing this we can make it a long term prescription" ? ?# urinary frequency ?S:going on 1-2 months. Feels like urine smells odorous more than usual and when he sweasts feels like smells likeurine. No burning or frequency. No penile discharge. No std screening needed- abstinent since last testing ?- some low back pain- doesn't think working out related  ?A/P: Update some blood work particularly with prediabetes history.  Also check urinalysis which was reassuring and urine culture ?-We did not delve fully in the back pain but could have him follow-up with blood work and urine reassuring and back pain worsens ? ?Recommended follow up: Return for next already scheduled visit or sooner if needed. ?Future Appointments  ?Date Time Provider Department Center  ?11/01/2021  1:00 PM Jabree Pernice, Aldine Contes, MD [facility]  ? ? ?Lab/Order associations: ?  ICD-10-CM   ?1. Polyuria  R35.89 POCT Urinalysis Dipstick  ?  ?2. Primary hypertension  I10 CBC with Differential/Platelet  ?  Comprehensive metabolic panel  ?  ?3. Hyperglycemia  R73.9 Hemoglobin A1c  ?  ? ? ?No orders of the defined types were placed in this encounter. ? ? ?Return precautions advised.  ?Tana Conch, MD ? ?

## 2021-08-01 NOTE — ED Provider Notes (Signed)
?[facility] ?Provider Note ? ? ?CSN: [id number] ?Arrival date & time: 08/01/21  1201 ? ?  ? ?History ?Chief Complaint  ?Patient presents with  ? Hypertension  ? ? ?Ross Ryan is a 41 y.o. male with h/o HTN presents to the ED for eval elevated blood pressure reading yesterday. The patient reports he has a h/o HTN and has not been on medication for this in over a year because he was controlling it with diet and exercise.  He reports he had a "flubbering" in his muscle on his left chest wall and was concerned so checked his blood pressure.  Three blood pressure recordings are 183/96, 160/93, 144/92. He denies any palpitations, blurry vision, headache, weakness, chest pain, shortness of breath.  Patient was previously on amlodipine 5 mg and has not been on this in a year.  He denies any other medical history, surgical history, daily medications or any drug allergies.  Denies any tobacco, EtOH, or illicit drug use ever.  ? ? ?Hypertension ?Pertinent negatives include no chest pain, no abdominal pain, no headaches and no shortness of breath.  ? ?  ? ?Home Medications ?Prior to Admission medications   ?Medication Sig Start Date End Date Taking? Authorizing Provider  ?omeprazole (PRILOSEC) 40 MG capsule TAKE 1 CAPSULE (40 MG TOTAL) BY MOUTH ONCE DAILY BEFORE BREAKFAST ?Patient not taking: Reported on 06/25/2021 04/16/20   Shelva Majestic, MD  ?   ? ?Allergies    ?Patient has no known allergies.   ? ?Review of Systems   ?Review of Systems  ?Constitutional:  Negative for chills and fever.  ?Eyes:  Negative for visual disturbance.  ?Respiratory:  Negative for shortness of breath.   ?Cardiovascular:  Negative for chest pain and palpitations.  ?Gastrointestinal:  Negative for abdominal pain and nausea.  ?Neurological:  Negative for weakness, light-headedness and headaches.  ? ?Physical Exam ?Updated Vital Signs ?BP (!) 141/92 (BP Location: Right Arm)   Pulse 62   Temp 98.6 ?F (37 ?C) (Oral)    Resp 16   Ht 6\' 4"  (1.93 m)   Wt 98 kg   SpO2 100%   BMI 26.29 kg/m?  ?Physical Exam ?Vitals and nursing note reviewed.  ?Constitutional:   ?   Appearance: Normal appearance.  ?HENT:  ?   Head: Normocephalic and atraumatic.  ?   Mouth/Throat:  ?   Mouth: Mucous membranes are moist.  ?   Comments: Braces present ?Eyes:  ?   General: No scleral icterus. ?Cardiovascular:  ?   Rate and Rhythm: Normal rate and regular rhythm.  ?   Pulses: Normal pulses.  ?   Heart sounds: Normal heart sounds.  ?Pulmonary:  ?   Effort: Pulmonary effort is normal. No respiratory distress.  ?   Breath sounds: Normal breath sounds.  ?Chest:  ?   Chest wall: No tenderness.  ?Abdominal:  ?   General: Abdomen is flat. Bowel sounds are normal.  ?   Palpations: Abdomen is soft.  ?   Tenderness: There is no abdominal tenderness. There is no guarding or rebound.  ?Musculoskeletal:     ?   General: No deformity. Normal range of motion.  ?   Cervical back: Normal range of motion.  ?   Right lower leg: No edema.  ?   Left lower leg: No edema.  ?Skin: ?   General: Skin is warm and dry.  ?Neurological:  ?   General: No focal deficit present.  ?  Mental Status: He is alert and oriented to person, place, and time. Mental status is at baseline.  ?   GCS: GCS eye subscore is 4. GCS verbal subscore is 5. GCS motor subscore is 6.  ?   Cranial Nerves: No cranial nerve deficit.  ?   Sensory: Sensation is intact.  ?   Motor: No weakness.  ?   Coordination: Coordination is intact. Finger-Nose-Finger Test and Heel to [place] Test normal.  ? ? ?ED Results / Procedures / Treatments   ?Labs ?(all labs ordered are listed, but only abnormal results are displayed) ?Labs Reviewed - No data to display ? ?EKG ?EKG Interpretation ? ?Date/Time:  Thursday August 01 2021 13:11:57 EST ?Ventricular Rate:  59 ?PR Interval:  169 ?QRS Duration: 107 ?QT Interval:  437 ?QTC Calculation: 433 ?R Axis:   11 ?Text Interpretation: Sinus rhythm RSR' in V1 or V2, right VCD or RVH flipped t  wave in lead III seen on prior No significant change since last tracing Confirmed by Melene Plan [phone]) on 08/01/2021 1:15:06 PM ? ?Radiology ?No results found. ? ?Procedures ?Procedures  ? ?Medications Ordered in ED ?Medications - No data to display ? ?ED Course/ Medical Decision Making/ A&P ?  ?                        ?Medical Decision Making ?Risk ?Prescription drug management. ? ? ?41 y/o M presents to the ED for evaluation of elevated blood pressure readings with a h/o HTN not on medications. Differential diagnosis includes but is not limited to palpitations, muscle spasm, stroke, HTN.  Show mildly elevated blood pressure at 140/88.  Afebrile, normal pulse rate, satting well on room air without any increased work of breathing.  Physical exam was unremarkable.  Patient has a neuro logical exam that is benign.  Regular rate and rhythm.  Clear to auscultation bilaterally for his lungs. ? ?EKG obtained and is not changed from previous per my attending. ? ?The patient does not have any chest pain or shortness of breath or any other symptoms.  Did not think labs are warranted at this time after discussing with my attending. ? ?At this time, the patient has a normal exam and has a known history of hypertension.  We will give him refills of his previous medication of amlodipine.  First dose given in the emergency department.  Recommended that he follow-up with his primary care provider for refills of this medication and for further investigation and lab work.  Patient stable being discharged home in good condition. ? ?Final Clinical Impression(s) / ED Diagnoses ?Final diagnoses:  ?Elevated blood pressure reading with diagnosis of hypertension  ? ? ?Rx / DC Orders ?ED Discharge Orders   ? ?      Ordered  ?  amLODipine (NORVASC) 5 MG tablet  Daily       ? 08/01/21 1241  ? ?  ?  ? ?  ? ? ?  ?Ross Rich, PA-C ?08/02/21 0045 ? ?  ?Melene Plan, DO ?08/02/21 [phone] ? ?

## 2021-08-01 NOTE — Discharge Instructions (Signed)
You were seen here today for evaluation of your elevated blood pressure.  I have represcribed your amlodipine 5 mg to take daily.  I have provided you two refills in the meantime.  Please call your primary care office to see them soon to get reestablished and to continue your prescription of your amlodipine.  If you have any blurry vision, headache, chest pain, shortness of breath, lightheadedness, weakness on 1 side your body, please return the nearest emergency department for evaluation. ? ?Patiently, and included a form for you to record your blood pressures on a daily to bring to your primary care provider.  Additional information on hypertension also included. ?

## 2021-08-01 NOTE — Telephone Encounter (Signed)
Patient has been scheduled for 3pm today.

## 2021-08-01 NOTE — ED Triage Notes (Signed)
Patient here POV from Home with HTN. ? ?Patient endorses taking Norvasc for HTN 2 years ago. States changes in Diet and Exercise and able to reduce BP and has not had to take Norvasc in over 1 year.  ? ?States he took his BP at Home yesterday and it was 183/96. No Pain. No SOB.  ? ?NAD Noted during Triage. A&Ox4. GCS 15. Ambulatory.  ? ? ?

## 2021-08-01 NOTE — Patient Instructions (Addendum)
Please stop by lab before you go ?If you have mychart- we will send your results within 3 business days of [place] receiving them.  ?If you do not have mychart- we will call you about results within 5 business days of [place] receiving them.  ?*please also note that you will see labs on mychart as soon as they post. I will later go in and write notes on them- will say "notes from Dr. Yong Channel"  ? ?Reasonable to recheck blood pressure if running high- rest for 5 minuts and think peaceful thoughts then check 2 more times and take average of last 2 and consider that your blood pressure. Goal at home >135/85 ? ?You can pick up amlodipine and take it if blood pressure >155/95 and if regularly needing this we can make it a long term prescription ? ?Recommended follow up: Return for next already scheduled visit or sooner if needed. ?

## 2021-08-01 NOTE — Telephone Encounter (Signed)
See below

## 2021-08-02 ENCOUNTER — Other Ambulatory Visit: Payer: Self-pay

## 2021-08-02 DIAGNOSIS — R739 Hyperglycemia, unspecified: Secondary | ICD-10-CM

## 2021-08-02 LAB — COMPREHENSIVE METABOLIC PANEL
ALT: 23 U/L (ref 0–53)
AST: 26 U/L (ref 0–37)
Albumin: 4.7 g/dL (ref 3.5–5.2)
Alkaline Phosphatase: 61 U/L (ref 39–117)
BUN: 14 mg/dL (ref 6–23)
CO2: 31 mEq/L (ref 19–32)
Calcium: 10 mg/dL (ref 8.4–10.5)
Chloride: 103 mEq/L (ref 96–112)
Creatinine, Ser: 1.34 mg/dL (ref 0.40–1.50)
GFR: 67.12 mL/min (ref >60.00)
Glucose, Bld: 90 mg/dL (ref 70–99)
Potassium: 4.3 mEq/L (ref 3.5–5.1)
Sodium: 141 mEq/L (ref 135–145)
Total Bilirubin: 0.7 mg/dL (ref 0.2–1.2)
Total Protein: 7.6 g/dL (ref 6.0–8.3)

## 2021-08-02 LAB — CBC WITH DIFFERENTIAL/PLATELET
Basophils Absolute: 0.1 10*3/uL (ref 0.0–0.1)
Basophils Relative: 1.1 % (ref 0.0–3.0)
Eosinophils Absolute: 0 10*3/uL (ref 0.0–0.7)
Eosinophils Relative: 0.4 % (ref 0.0–5.0)
HCT: 43.2 % (ref 39.0–52.0)
Hemoglobin: 13.7 g/dL (ref 13.0–17.0)
Lymphocytes Relative: 42.1 % (ref 12.0–46.0)
Lymphs Abs: 2.3 10*3/uL (ref 0.7–4.0)
MCHC: 31.7 g/dL (ref 30.0–36.0)
MCV: 82.6 fl (ref 78.0–100.0)
Monocytes Absolute: 0.8 10*3/uL (ref 0.1–1.0)
Monocytes Relative: 14.7 % — ABNORMAL HIGH (ref 3.0–12.0)
Neutro Abs: 2.3 10*3/uL (ref 1.4–7.7)
Neutrophils Relative %: 41.7 % — ABNORMAL LOW (ref 43.0–77.0)
Platelets: 214 10*3/uL (ref 150.0–400.0)
RBC: 5.23 Mil/uL (ref 4.22–5.81)
RDW: 13.4 % (ref 11.5–15.5)
WBC: 5.5 10*3/uL (ref 4.0–10.5)

## 2021-08-03 LAB — HEMOGLOBIN A1C
Hgb A1c MFr Bld: 4.6 % of total Hgb (ref <5.7)
Mean Plasma Glucose: 85 mg/dL
eAG (mmol/L): 4.7 mmol/L

## 2021-08-15 ENCOUNTER — Encounter: Payer: Self-pay | Admitting: Family Medicine

## 2021-08-15 ENCOUNTER — Other Ambulatory Visit: Payer: Self-pay | Admitting: Family Medicine

## 2021-11-01 ENCOUNTER — Encounter: Payer: Self-pay | Admitting: Family Medicine

## 2021-11-01 ENCOUNTER — Other Ambulatory Visit (HOSPITAL_COMMUNITY)
Admission: RE | Admit: 2021-11-01 | Discharge: 2021-11-01 | Disposition: A | Discharge status: Home / Self Care | Payer: Medicaid Other | Source: Ambulatory Visit | Attending: Family Medicine | Admitting: Family Medicine

## 2021-11-01 ENCOUNTER — Ambulatory Visit (INDEPENDENT_AMBULATORY_CARE_PROVIDER_SITE_OTHER): Payer: Self-pay | Admitting: Family Medicine

## 2021-11-01 VITALS — BP 122/78 | HR 67 | Temp 98.8°F | Ht 76.0 in | Wt 215.6 lb

## 2021-11-01 DIAGNOSIS — I10 Essential (primary) hypertension: Secondary | ICD-10-CM

## 2021-11-01 DIAGNOSIS — D564 Hereditary persistence of fetal hemoglobin [HPFH]: Secondary | ICD-10-CM

## 2021-11-01 DIAGNOSIS — Z118 Encounter for screening for other infectious and parasitic diseases: Secondary | ICD-10-CM | POA: Diagnosis not present

## 2021-11-01 DIAGNOSIS — Z113 Encounter for screening for infections with a predominantly sexual mode of transmission: Secondary | ICD-10-CM

## 2021-11-01 DIAGNOSIS — Z0000 Encounter for general adult medical examination without abnormal findings: Secondary | ICD-10-CM

## 2021-11-01 DIAGNOSIS — Z114 Encounter for screening for human immunodeficiency virus [HIV]: Secondary | ICD-10-CM | POA: Diagnosis not present

## 2021-11-01 DIAGNOSIS — F3342 Major depressive disorder, recurrent, in full remission: Secondary | ICD-10-CM

## 2021-11-01 LAB — CBC WITH DIFFERENTIAL/PLATELET
Basophils Absolute: 0 10*3/uL (ref 0.0–0.1)
Basophils Relative: 0.6 % (ref 0.0–3.0)
Eosinophils Absolute: 0 10*3/uL (ref 0.0–0.7)
Eosinophils Relative: 0.8 % (ref 0.0–5.0)
HCT: 41.7 % (ref 39.0–52.0)
Hemoglobin: 13.4 g/dL (ref 13.0–17.0)
Lymphocytes Relative: 42.5 % (ref 12.0–46.0)
Lymphs Abs: 1.9 10*3/uL (ref 0.7–4.0)
MCHC: 32.1 g/dL (ref 30.0–36.0)
MCV: 82.5 fl (ref 78.0–100.0)
Monocytes Absolute: 0.5 10*3/uL (ref 0.1–1.0)
Monocytes Relative: 11.4 % (ref 3.0–12.0)
Neutro Abs: 2 10*3/uL (ref 1.4–7.7)
Neutrophils Relative %: 44.7 % (ref 43.0–77.0)
Platelets: 194 10*3/uL (ref 150.0–400.0)
RBC: 5.05 Mil/uL (ref 4.22–5.81)
RDW: 13.3 % (ref 11.5–15.5)
WBC: 4.5 10*3/uL (ref 4.0–10.5)

## 2021-11-01 LAB — COMPREHENSIVE METABOLIC PANEL
ALT: 20 U/L (ref 0–53)
AST: 21 U/L (ref 0–37)
Albumin: 4.6 g/dL (ref 3.5–5.2)
Alkaline Phosphatase: 53 U/L (ref 39–117)
BUN: 13 mg/dL (ref 6–23)
CO2: 31 mEq/L (ref 19–32)
Calcium: 10 mg/dL (ref 8.4–10.5)
Chloride: 101 mEq/L (ref 96–112)
Creatinine, Ser: 1.17 mg/dL (ref 0.40–1.50)
GFR: 78.85 mL/min (ref >60.00)
Glucose, Bld: 86 mg/dL (ref 70–99)
Potassium: 4 mEq/L (ref 3.5–5.1)
Sodium: 139 mEq/L (ref 135–145)
Total Bilirubin: 0.7 mg/dL (ref 0.2–1.2)
Total Protein: 7.8 g/dL (ref 6.0–8.3)

## 2021-11-01 LAB — LIPID PANEL
Cholesterol: 104 mg/dL (ref 0–200)
HDL: 50.4 mg/dL (ref >39.00)
LDL Cholesterol: 37 mg/dL (ref 0–99)
NonHDL: 53.89
Total CHOL/HDL Ratio: 2
Triglycerides: 82 mg/dL (ref 0.0–149.0)
VLDL: 16.4 mg/dL (ref 0.0–40.0)

## 2021-11-01 MED ORDER — CLOBETASOL PROPIONATE 0.05 % EX OINT: TOPICAL_OINTMENT | CUTANEOUS | 1 refills | Status: DC

## 2021-11-01 NOTE — Patient Instructions (Addendum)
Please stop by lab before you go If you have mychart- we will send your results within 3 business days of [place] receiving them.  If you do not have mychart- we will call you about results within 5 business days of [place] receiving them.  *please also note that you will see labs on mychart as soon as they post. I will later go in and write notes on them- will say "notes from Dr. Durene Cal"   Recommended follow up: Return in about 6 months (around 05/03/2022) for followup or sooner if needed.Schedule b4 you leave.

## 2021-11-01 NOTE — Progress Notes (Signed)
Phone: [phone]    Subjective:  Patient presents today for their annual physical. Chief complaint-noted.   See problem oriented charting- Review of Systems  Constitutional:  Negative for chills and fever.  HENT:  Negative for congestion and sinus pain.   Eyes:  Negative for blurred vision and double vision.  Respiratory:  Negative for cough and shortness of breath.   Cardiovascular:  Positive for chest pain (occasional mild with anxiety- none with exercise). Negative for palpitations.  Gastrointestinal:  Positive for heartburn. Negative for blood in stool, constipation, diarrhea, melena and nausea.  Genitourinary:  Positive for frequency (unchanged). Negative for dysuria.  Musculoskeletal:  Negative for back pain, myalgias and neck pain.  Skin:  Positive for rash (intermittent right lower leg). Negative for itching.  Neurological:  Negative for dizziness and headaches.  Endo/Heme/Allergies:  Negative for polydipsia. Does not bruise/bleed easily.  Psychiatric/Behavioral:  Negative for depression and suicidal ideas.    The following were reviewed and entered/updated in epic: Past Medical History:  Diagnosis Date   Hypertension 12/2016   Patient Active Problem List   Diagnosis Date Noted   Cluster headache 05/03/2021    Priority: Medium    GAD (generalized anxiety disorder) 05/03/2021    Priority: Medium    Gastroesophageal reflux disease without esophagitis 11/02/2018    Priority: Medium    Major depression in full remission ([place]) 07/05/2018    Priority: Medium    Hypertension 07/05/2018    Priority: Medium    Throat irritation 07/05/2018    Priority: Low   Weight loss 07/05/2018    Priority: Low   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[facility]  04/02/2017    Priority: Low   Vitamin B12 deficiency - oral repletion  02/02/2017    Priority: Low   Hereditary persistence of fetal hemoglobin ([place]) 12/31/2016    Priority: Low   Past Surgical History:  Procedure  Laterality Date   Fatty tumor removed from right Posterior forearm Right 2010   KNEE SURGERY      Family History  Problem Relation Age of Onset   Hypertension Mother    Heart disease Mother        in her 68s   Diabetes Father    Hyperlipidemia Father    Hypertension Father    Healthy Sister    Healthy Brother    Dementia Maternal Grandmother    Other Maternal Grandfather        doesnt know cause of death   Prostate cancer Maternal Grandfather        55s   Heart disease Paternal Grandmother    Other Paternal Grandfather        doesnt know cause of death   Prostate cancer Maternal Uncle        in 58s   Prostate cancer Cousin        in 60s    Medications- reviewed and updated Current Outpatient Medications  Medication Sig Dispense Refill   clobetasol ointment (TEMOVATE) 0.05 % Use once daily only on affected areas on right lower leg. Do not use on normal skin. 60 g 1   omeprazole (PRILOSEC) 40 MG capsule TAKE ONE CAPSULE BY MOUTH EVERY MORNING BEFORE BREAKFAST 90 capsule 3   No current facility-administered medications for this visit.    Allergies-reviewed and updated No Known Allergies  Social History   Social History Narrative   Lives alone.       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 Oropharynx normal. 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 No rebound or guarding.  Ext: no edema Skin: warm, dry, papules on posterior portion right  lower leg (rash on leg- refilled clobetasol previously given by dermatology) Neuro: grossly normal, moves all extremities, PERRLA GU: testicular exam normal    Assessment and Plan:  41 y.o. male presenting for annual physical.  Health Maintenance counseling: 1. Anticipatory guidance: Patient counseled regarding regular dental exams -q6 months, eye exams -no vision issues,  avoiding smoking and second hand smoke- see below , limiting alcohol to 2 beverages per day- other than the last week, no illicit drugs.   2. Risk factor reduction:  Advised patient of need for regular exercise and diet rich and fruits and vegetables to reduce risk of heart attack and stroke.  Exercise- 3-5 days a week with 15 mins cardio and lifts weight.  Diet/weight management-Down 4 pounds from last year.  Wt Readings from Last 3 Encounters:  11/01/21 215 lb 9.6 oz (97.8 kg)  08/01/21 219 lb (99.3 kg)  08/01/21 216 lb (98 kg)  3. Immunizations/screenings/ancillary studies- not interested in covid vaccines otherwise up ot date Immunization History  Administered Date(s) Administered   DTaP 02/07/1983, 05/06/1983, 08/22/1983, 04/07/1986, 09/28/1987   Hepatitis A 11/09/2017, 05/12/2018   Hepatitis B 11/09/2017, 01/08/2018, 05/12/2018   IPV 02/07/1983, 05/06/1983, 08/22/1983, 09/28/1987   MMR 04/16/1984   PPD Test 02/29/2016   Tdap 07/05/2018  4. Prostate cancer screening- nocturia twice a night last year so we checked a PSA that was in low risk range-as far as urinary symptoms . Did have cousin with prostate cancer in 6s- consider starting regularly at 77 Lab Results  Component Value Date   PSA 0.43 10/29/2020   5. Colon cancer screening -  no family history, start at age 24  6. Skin cancer screening/prevention-low risk due to melanin content. advised regular sunscreen use. Denies worrisome, changing, or new skin lesions.  7. Testicular cancer screening- advised monthly self exams  8. STD screening- patient opts  in- has saem partner over last year but off and on so wants to be safe- does not always use protection- encouraged 9. Smoking associated screening-last year smoking 2 to 4 cigarettes/day-currently 1 cigarette per day- encouraged full cessation .   Status of chronic or acute concerns   #social update- just got back from the beach with friends. More alcohol than usual - worries about his LFTs  #hypertension S: medication: None-has preferred to work on lifestyle but tends to run high normal Home readings #s: no recent checks BP Readings from Last 3 Encounters:  11/01/21 122/78  08/01/21 136/78  08/01/21 140/88  A/P: blood pressure looks excellent on repeat without meds- encouraged home monitoring (not doing) and stress management- does better when #s loewr  #lipids looked great last year with LDL under 70 but will monitor with labs  # Depression S: Medication: None-continued full remission  11/01/2021    1:06 PM 05/03/2021    8:09 AM 10/29/2020    9:24 AM  Depression screen PHQ 2/9  Decreased Interest 0 0 0  Down, Depressed, Hopeless 0 0 0  PHQ - 2 Score 0 0 0  Altered sleeping 0 1 2  Tired, decreased energy 0 0 0  Change in appetite 0 0 0  Feeling bad or failure about yourself  0 0 0  Trouble concentrating 0 0 0  Moving slowly or fidgety/restless 0 0 0  Suicidal thoughts 0 0 0  PHQ-9 Score 0 1 2  Difficult doing work/chores Not difficult at all Not difficult at all Not difficult at all  A/P: Full remission-continue to monitor without medicine  #noted persistence fetal hemoglobin but no issues  Recommended follow up: Return in about 6 months (around 05/03/2022) for followup or sooner if needed.Schedule b4 you leave.  Lab/Order associations:NOT fasting   ICD-10-CM   1. Preventative health care  Z00.00 CBC with Differential/Platelet    Comprehensive metabolic panel    Lipid panel    HIV Antibody (routine testing w rflx)    RPR    Urine cytology ancillary only    2. Primary  hypertension  I10 CBC with Differential/Platelet    Comprehensive metabolic panel    Lipid panel    3. Screening for HIV (human immunodeficiency virus)  Z11.4 HIV Antibody (routine testing w rflx)    4. Screening examination for venereal disease  Z11.3 RPR    5. Screening for gonorrhea  Z11.3 Urine cytology ancillary only    6. Screening for chlamydial disease  Z11.8 Urine cytology ancillary only      Meds ordered this encounter  Medications   clobetasol ointment (TEMOVATE) 0.05 %    Sig: Use once daily only on affected areas on right lower leg. Do not use on normal skin.    Dispense:  60 g    Refill:  1    Return precautions advised.   Garret Reddish, MD

## 2021-11-04 LAB — URINE CYTOLOGY ANCILLARY ONLY
Chlamydia: NEGATIVE
Comment: NEGATIVE
Comment: NORMAL
Neisseria Gonorrhea: NEGATIVE

## 2021-11-04 LAB — RPR: RPR Ser Ql: NONREACTIVE

## 2021-11-04 LAB — HIV ANTIBODY (ROUTINE TESTING W REFLEX): HIV 1&2 Ab, 4th Generation: NONREACTIVE

## 2021-12-23 ENCOUNTER — Ambulatory Visit: Payer: Self-pay | Admitting: Family

## 2021-12-23 ENCOUNTER — Encounter: Payer: Self-pay | Admitting: Family Medicine

## 2022-01-20 IMAGING — CT CT HEAD W/O CM
4 series · 17 of 47 positions shown, 19 images · non-contrast
Comparison: CT head dated 08/28/2018.

CLINICAL DATA: Headache.

EXAM:
CT HEAD WITHOUT CONTRAST
TECHNIQUE: Contiguous axial images were obtained from the base of the skull
through the vertex without intravenous contrast.

[Series 2: head wo · axial · 0.47mm/px · z∈[-46,+74]mm · 7 of 34 slices shown, 9 images]
[im 5/34  brain]
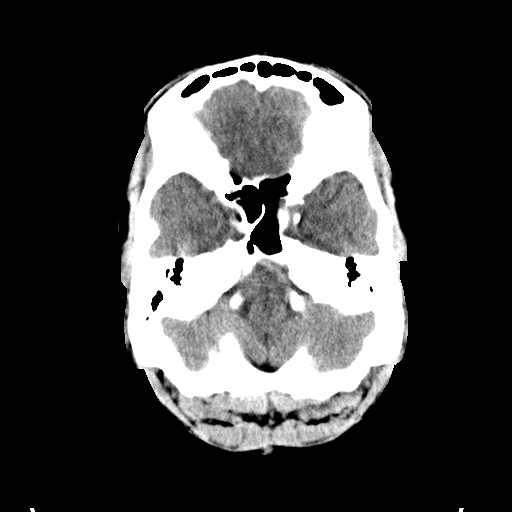
[im 5/34  bone]
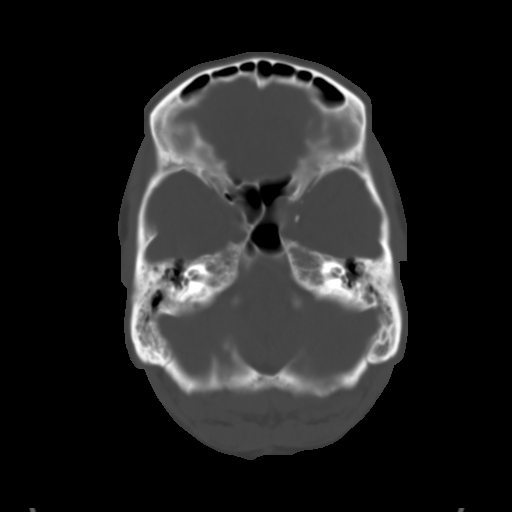
[im 9/34  brain]
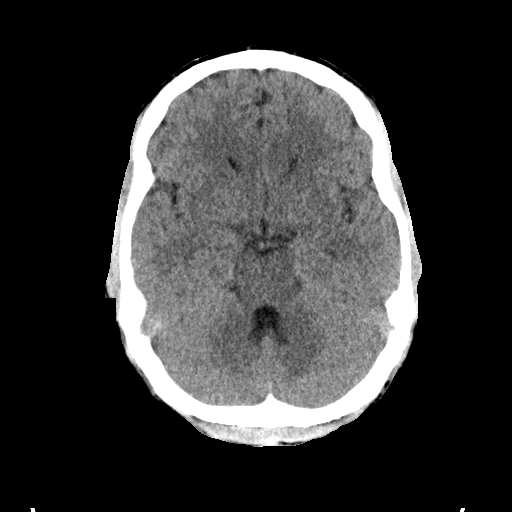
[im 13/34  brain]
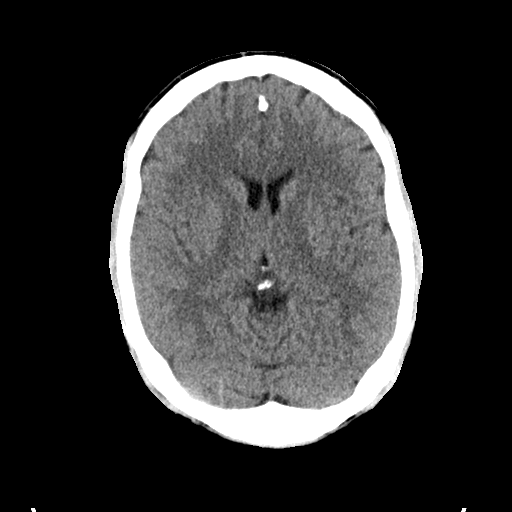
[im 17/34  brain]
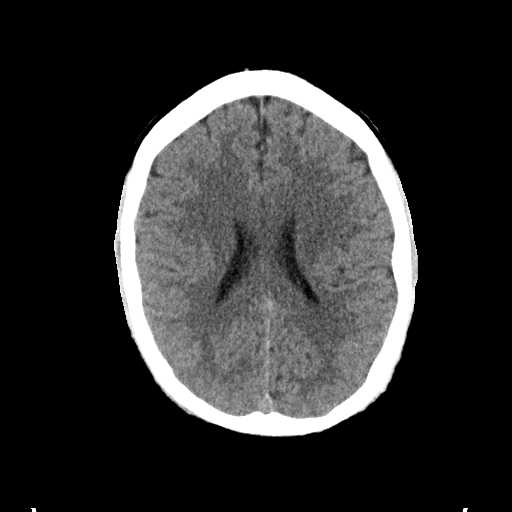
[im 21/34  brain]
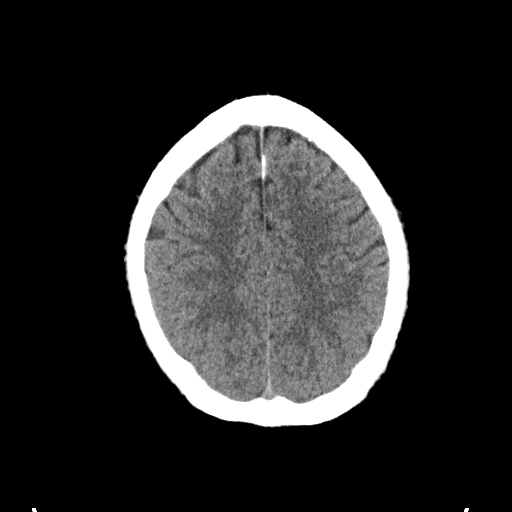
[im 21/34  bone]
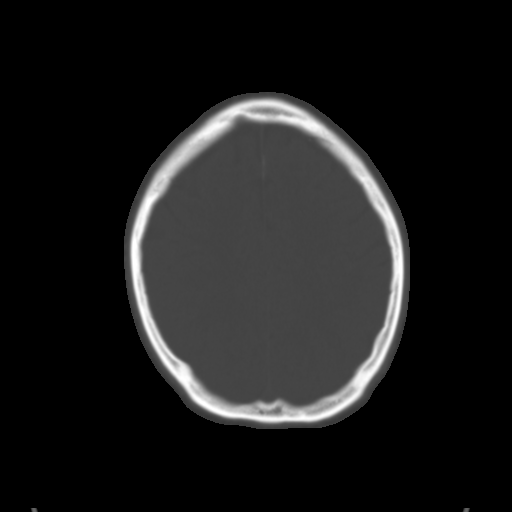
[im 25/34  brain]
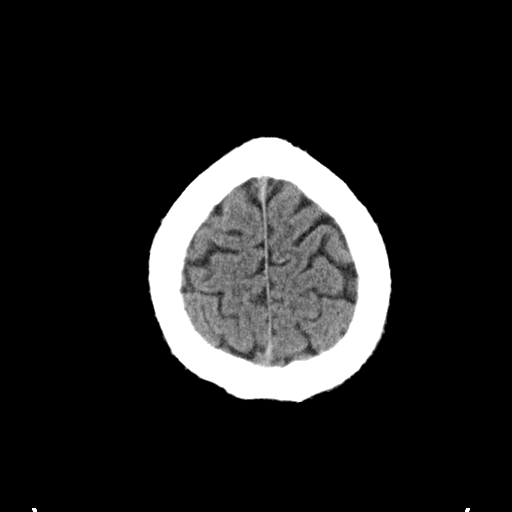
[im 29/34  brain]
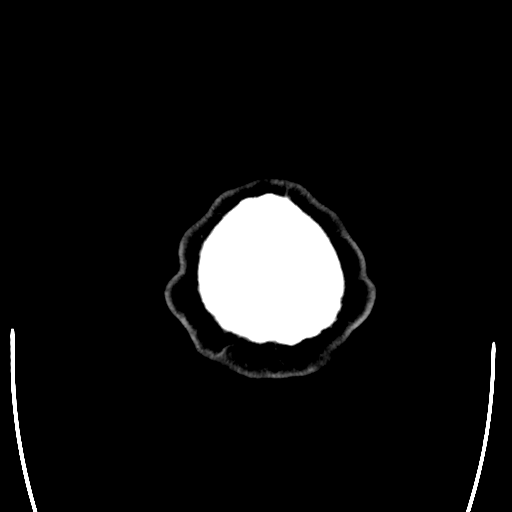

[Series 3: head bone · axial · 0.47mm/px · z∈[-50,+6]mm · 4 of 83 slices shown]
[im 9/83  bone]
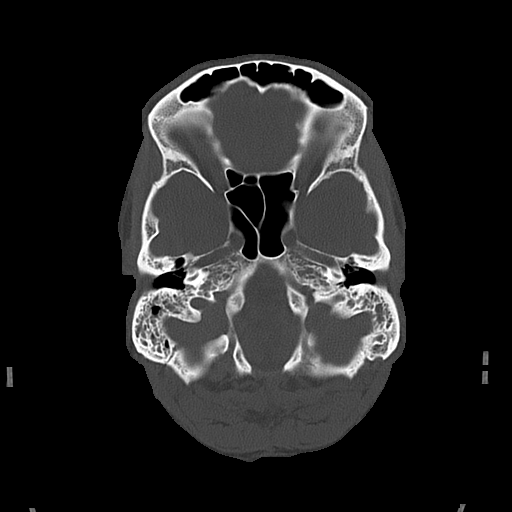
[im 17/83  bone]
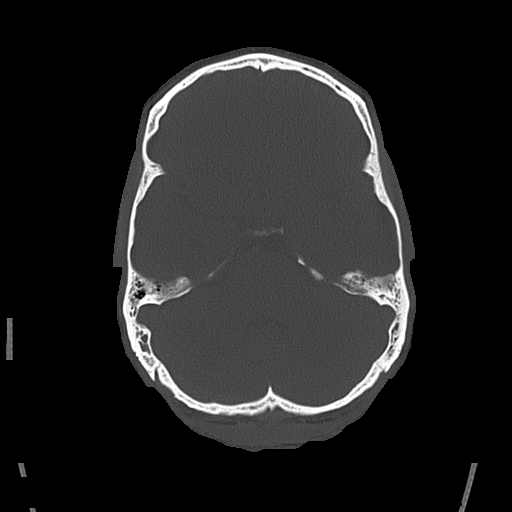
[im 25/83  bone]
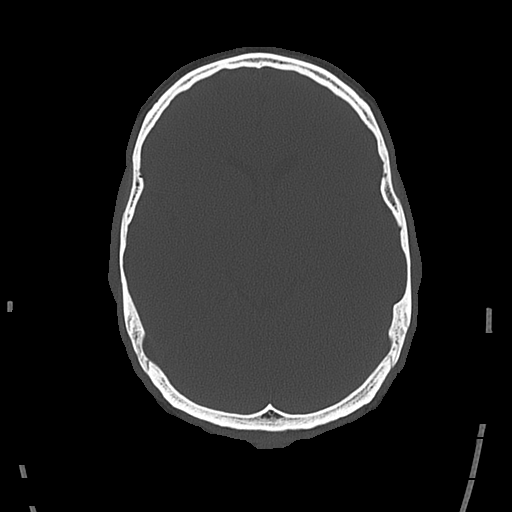
[im 37/83  bone]
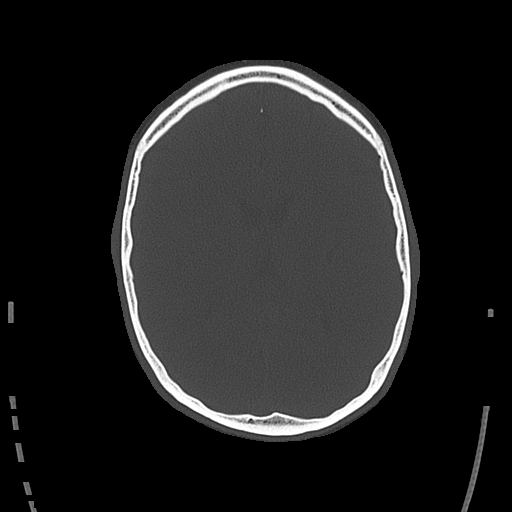

[Series 4: coronal soft · coronal · 0.35mm/px · 3 of 65 slices shown]
[im 22/65  brain]
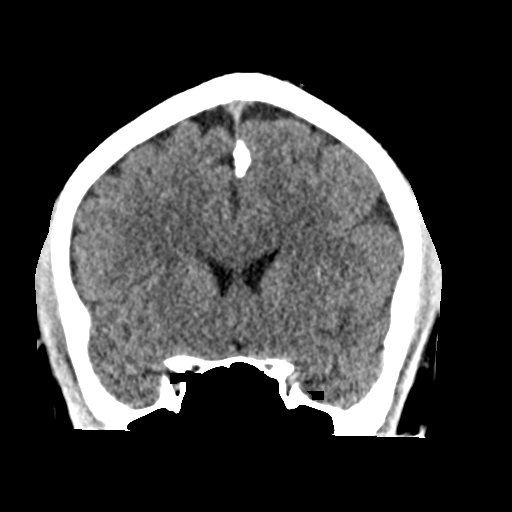
[im 29/65  brain]
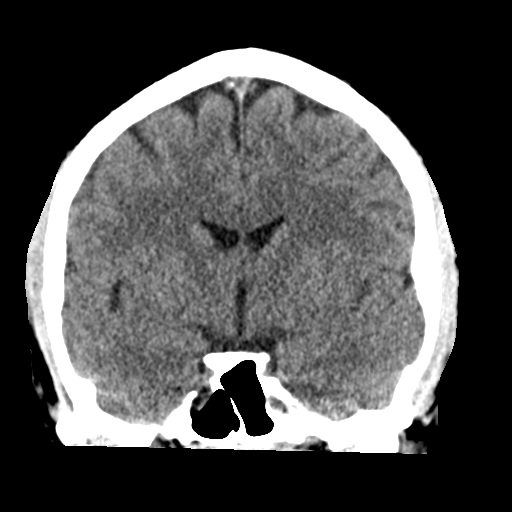
[im 36/65  brain]
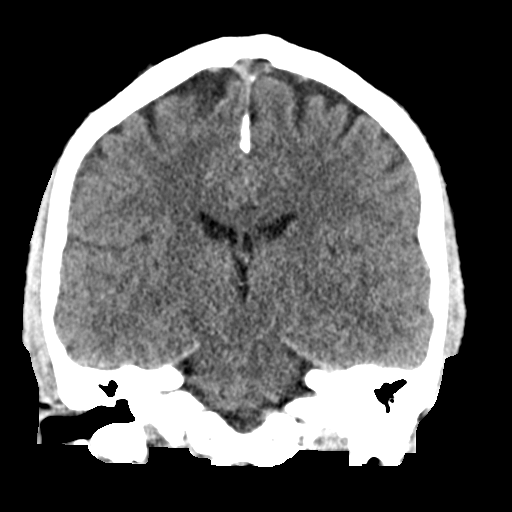

[Series 5: sagittal soft · sagittal · 0.35mm/px · 3 of 56 slices shown]
[im 19/56  brain]
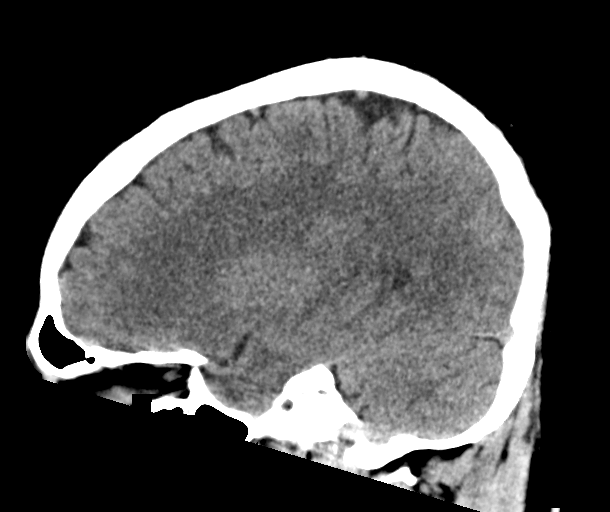
[im 28/56  brain]
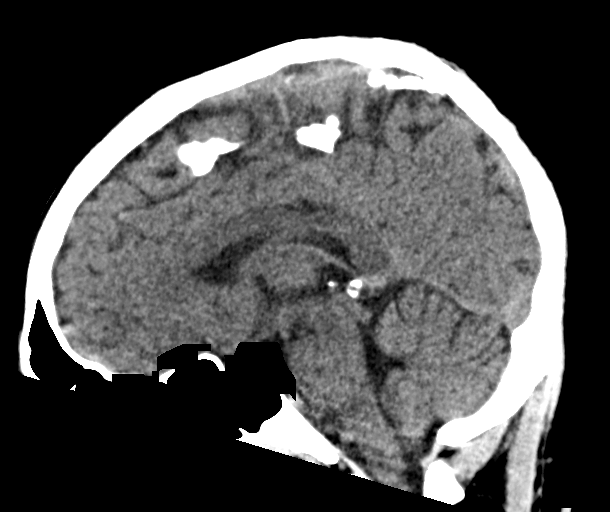
[im 37/56  brain]
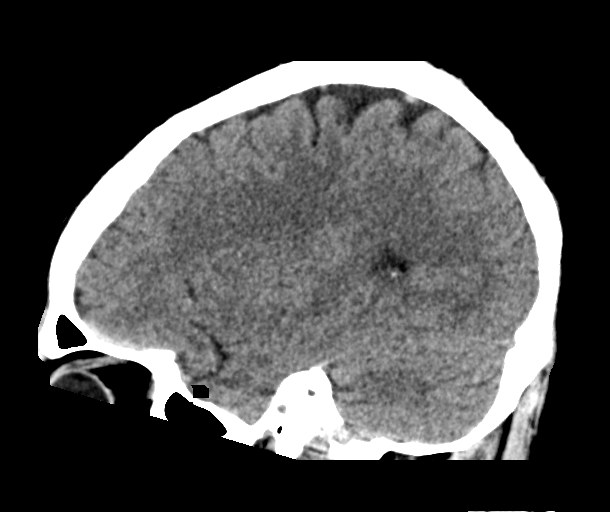

[17 of 47 positions shown; findings below may reference images not displayed]

FINDINGS: Brain: No evidence of acute infarction, hemorrhage, hydrocephalus,
extra-axial collection or mass lesion/mass effect.

Vascular: No hyperdense vessel or unexpected calcification.

Skull: Normal. Negative for fracture or focal lesion.

Sinuses/Orbits: There is chronic opacification of the bilateral
mastoid air cells. The paranasal sinuses appear normal.

Other: None.
IMPRESSION: No acute intracranial process. Chronic opacification of the
bilateral mastoid air cells.

## 2022-03-22 ENCOUNTER — Encounter (HOSPITAL_BASED_OUTPATIENT_CLINIC_OR_DEPARTMENT_OTHER): Payer: Self-pay | Admitting: Emergency Medicine

## 2022-03-22 ENCOUNTER — Emergency Department (HOSPITAL_BASED_OUTPATIENT_CLINIC_OR_DEPARTMENT_OTHER)
Admission: EM | Admit: 2022-03-22 | Discharge: 2022-03-22 | Discharge status: Against Medical Advice | Payer: Medicaid Other | Attending: Emergency Medicine | Admitting: Emergency Medicine

## 2022-03-22 ENCOUNTER — Other Ambulatory Visit: Payer: Self-pay

## 2022-03-22 DIAGNOSIS — Z5321 Procedure and treatment not carried out due to patient leaving prior to being seen by health care provider: Secondary | ICD-10-CM | POA: Diagnosis not present

## 2022-03-22 DIAGNOSIS — G43909 Migraine, unspecified, not intractable, without status migrainosus: Secondary | ICD-10-CM | POA: Diagnosis not present

## 2022-03-22 DIAGNOSIS — R519 Headache, unspecified: Secondary | ICD-10-CM | POA: Diagnosis present

## 2022-03-22 HISTORY — DX: Migraine, unspecified, not intractable, without status migrainosus: G43.909

## 2022-03-22 NOTE — ED Triage Notes (Addendum)
Pt reports HA since Tues; sts worse when lying down; hx of migraines; has taken advil, but has no prescription migraine meds; c/o RT eye pain; c/o change in vision, but is unable to describe the change or what is different; PERRLA; took Advil PTA

## 2022-04-01 NOTE — Progress Notes (Unsigned)
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  He is concerned because all previous clusters were associated with a secondary cause such as dental injection or ear infection.  Will look for secondary intracranial etiology   MRI brain w/wo and MRA of head Prednisone taper to break cluster:  60mg  daily x3 days, then 40mg  daily for 3 days, then 20mg  daily for 3 days, then stop.  If ineffective would initiate verapamil Follow up 4-5 months.  Subjective:  Ross Ryan is a 41 year old right-handed male with HTN and depression who follows up for cluster headache.   UPDATE: Started another cluster 3 weeks ago.  The first cluster since I last saw him in late January.  Still ongoing.  Wakes him up at night.  Severe.  Once he is up, it lasts 1 to 3 hours.  The first week, it occurred every night but has since started tapering off.     Frequency of abortive medication: none Current NSAIDS/analgesics:  ibuprofen 600mg  Current triptans:  none Current ergotamine:  none Current anti-emetic:  none Current muscle relaxants:  none Current Antihypertensive medications:  none Current Antidepressant medications:  none Current Anticonvulsant medications:  none Current anti-CGRP:  none Current Vitamins/Herbal/Supplements:  none Current Antihistamines/Decongestants:  none Other therapy:  none Hormone/birth control:  none     HISTORY:  Started in June 2022.  Severe shooting pain that starts at the right TMJ and travels up behind the right eye, temple and behind the ear.  Symptoms similar to when he previously had mastoiditis.  Took antibiotics which was ineffective..  Saw the dentist and underwent root canal which was ineffective.  He has associated blurred vision in right eye, right eye lacrimation, right-sided rhinorrhea, and photophobia.  No nausea, vomiting, ptosis, conjunctival injection, speech  disturbance, numbness or weakness.  It lasts 1 hour with sumatriptan 100mg  tablet, otherwise up to 3 hours.  It occurs 1 to 2 times a day.  When he has it, he cannot keep still (paces, rocks back and forth).  Alcohol and marijuana smoke are triggers.  Nothing really relieves it.  He went to the ED on 11/15/2020 and 11/22/2020 for this.  CT head on 11/22/2020 personally reviewed showed bilateral opacification of the bilateral mastoids but no acute findings.  He was told that he likely has migraines.  He denies prior history of migraines.    Past NSAIDS/analgesics:  meloxicam, naproxen Past abortive triptans:  sumatriptan 100mg  Past abortive ergotamine:  none Past muscle relaxants:  Flexeril, Robaxin Past anti-emetic:  Reglan Past antihypertensive medications:  verapamil (helped), amlodipine Past antidepressant medications:  fluoxetine, mirtazpine Past anticonvulsant medications:  none Past anti-CGRP:  none Past vitamins/Herbal/Supplements:  none Past antihistamines/decongestants:  Flonase Other past therapies:  none  PAST MEDICAL HISTORY: Past Medical History:  Diagnosis Date   Hypertension 12/2016   Migraines     MEDICATIONS: Current Outpatient Medications on File Prior to Visit  Medication Sig Dispense Refill   clobetasol ointment (TEMOVATE) 0.05 % Use once daily only on affected areas on right lower leg. Do not use on normal skin. 60 g 1   omeprazole (PRILOSEC) 40 MG capsule TAKE ONE CAPSULE BY MOUTH EVERY MORNING BEFORE BREAKFAST 90 capsule 3   No current facility-administered medications on file prior to visit.    ALLERGIES: No Known Allergies  FAMILY HISTORY: Family History  Problem Relation Age of Onset   Hypertension  Mother    Heart disease Mother        in her 90s   Diabetes Father    Hyperlipidemia Father    Hypertension Father    Healthy Sister    Healthy Brother    Dementia Maternal Grandmother    Other Maternal Grandfather        doesnt know cause of death    Prostate cancer Maternal Grandfather        63s   Heart disease Paternal Grandmother    Other Paternal Grandfather        doesnt know cause of death   Prostate cancer Maternal Uncle        in 62s   Prostate cancer Cousin        in 22s      Objective:  Blood pressure 132/79, pulse 64, height 6\' 4"  (1.93 m), weight 215 lb 12.8 oz (97.9 kg), SpO2 100 %. General: No acute distress.  Patient appears well-groomed.   Head:  Normocephalic/atraumatic Eyes:  Fundi examined but not visualized Neck: supple, no paraspinal tenderness, full range of motion Heart:  Regular rate and rhythm Neurological Exam: alert and oriented to person, place, and time.  Speech fluent and not dysarthric, language intact.  CN II-XII intact. Bulk and tone normal, muscle strength 5/5 throughout.  Sensation to light touch intact.  Deep tendon reflexes 2+ throughout, toes downgoing.  Finger to nose testing intact.  Gait normal, Romberg negative.   Metta Clines, DO  CC: Garret Reddish, MD

## 2022-04-02 ENCOUNTER — Encounter: Payer: Self-pay | Admitting: Neurology

## 2022-04-02 ENCOUNTER — Ambulatory Visit: Payer: Medicaid Other | Admitting: Neurology

## 2022-04-02 VITALS — BP 132/79 | HR 64 | Ht 76.0 in | Wt 215.8 lb

## 2022-04-02 DIAGNOSIS — R519 Headache, unspecified: Secondary | ICD-10-CM

## 2022-04-02 MED ORDER — PREDNISONE 20 MG PO TABS: ORAL_TABLET | ORAL | 0 refills | Status: DC

## 2022-04-02 NOTE — Patient Instructions (Signed)
Check MRI of brain with and without contrast and MRA of head Take prednisone taper as directed.  If headaches do not break, contact me and we may need to start a daily medication. Follow up 4-5 months.

## 2022-04-10 ENCOUNTER — Ambulatory Visit
Admission: RE | Admit: 2022-04-10 | Discharge: 2022-04-10 | Disposition: A | Discharge status: Home / Self Care | Payer: Medicaid Other | Source: Ambulatory Visit | Attending: Neurology | Admitting: Neurology

## 2022-04-10 ENCOUNTER — Encounter: Payer: Self-pay | Admitting: Neurology

## 2022-04-10 DIAGNOSIS — R519 Headache, unspecified: Secondary | ICD-10-CM

## 2022-04-10 MED ORDER — GADOPICLENOL 0.5 MMOL/ML IV SOLN
10.0000 mL | Freq: Once | INTRAVENOUS | Status: AC | PRN
  Administered 2022-04-10: 10 mL via INTRAVENOUS

## 2022-04-11 ENCOUNTER — Other Ambulatory Visit: Payer: Self-pay | Admitting: Neurology

## 2022-04-11 DIAGNOSIS — L089 Local infection of the skin and subcutaneous tissue, unspecified: Secondary | ICD-10-CM | POA: Diagnosis not present

## 2022-04-11 DIAGNOSIS — L280 Lichen simplex chronicus: Secondary | ICD-10-CM | POA: Diagnosis not present

## 2022-04-11 MED ORDER — VERAPAMIL HCL 80 MG PO TABS: 80.0000 mg | ORAL_TABLET | Freq: Three times a day (TID) | ORAL | 5 refills | Status: DC

## 2022-04-11 MED ORDER — SUMATRIPTAN 20 MG/ACT NA SOLN: 20.0000 mg | NASAL | 5 refills | Status: DC | PRN

## 2022-04-14 ENCOUNTER — Encounter: Payer: Self-pay | Admitting: Family Medicine

## 2022-04-14 ENCOUNTER — Other Ambulatory Visit: Payer: Self-pay

## 2022-04-14 DIAGNOSIS — R202 Paresthesia of skin: Secondary | ICD-10-CM

## 2022-04-14 DIAGNOSIS — R519 Headache, unspecified: Secondary | ICD-10-CM

## 2022-04-14 DIAGNOSIS — G44009 Cluster headache syndrome, unspecified, not intractable: Secondary | ICD-10-CM

## 2022-04-14 DIAGNOSIS — I729 Aneurysm of unspecified site: Secondary | ICD-10-CM

## 2022-04-17 ENCOUNTER — Other Ambulatory Visit: Payer: Self-pay

## 2022-04-17 ENCOUNTER — Emergency Department (HOSPITAL_BASED_OUTPATIENT_CLINIC_OR_DEPARTMENT_OTHER)
Admission: EM | Admit: 2022-04-17 | Discharge: 2022-04-17 | Disposition: A | Discharge status: Home / Self Care | Payer: Medicaid Other | Attending: Emergency Medicine | Admitting: Emergency Medicine

## 2022-04-17 ENCOUNTER — Emergency Department (HOSPITAL_BASED_OUTPATIENT_CLINIC_OR_DEPARTMENT_OTHER): Payer: Medicaid Other

## 2022-04-17 ENCOUNTER — Encounter (HOSPITAL_BASED_OUTPATIENT_CLINIC_OR_DEPARTMENT_OTHER): Payer: Self-pay | Admitting: Emergency Medicine

## 2022-04-17 DIAGNOSIS — Z79899 Other long term (current) drug therapy: Secondary | ICD-10-CM | POA: Insufficient documentation

## 2022-04-17 DIAGNOSIS — U071 COVID-19: Secondary | ICD-10-CM | POA: Insufficient documentation

## 2022-04-17 DIAGNOSIS — I10 Essential (primary) hypertension: Secondary | ICD-10-CM | POA: Diagnosis not present

## 2022-04-17 DIAGNOSIS — E876 Hypokalemia: Secondary | ICD-10-CM | POA: Insufficient documentation

## 2022-04-17 DIAGNOSIS — R0602 Shortness of breath: Secondary | ICD-10-CM | POA: Diagnosis not present

## 2022-04-17 DIAGNOSIS — R000 Tachycardia, unspecified: Secondary | ICD-10-CM | POA: Insufficient documentation

## 2022-04-17 DIAGNOSIS — R079 Chest pain, unspecified: Secondary | ICD-10-CM | POA: Diagnosis not present

## 2022-04-17 LAB — RESP PANEL BY RT-PCR (FLU A&B, COVID) ARPGX2
Influenza A by PCR: NEGATIVE
Influenza B by PCR: NEGATIVE
SARS Coronavirus 2 by RT PCR: POSITIVE — AB

## 2022-04-17 LAB — CBC
HCT: 37.3 % — ABNORMAL LOW (ref 39.0–52.0)
Hemoglobin: 12 g/dL — ABNORMAL LOW (ref 13.0–17.0)
MCH: 26 pg (ref 26.0–34.0)
MCHC: 32.2 g/dL (ref 30.0–36.0)
MCV: 80.9 fL (ref 80.0–100.0)
Platelets: 197 10*3/uL (ref 150–400)
RBC: 4.61 MIL/uL (ref 4.22–5.81)
RDW: 13.9 % (ref 11.5–15.5)
WBC: 8.5 10*3/uL (ref 4.0–10.5)
nRBC: 0 % (ref 0.0–0.2)

## 2022-04-17 LAB — BASIC METABOLIC PANEL
Anion gap: 7 (ref 5–15)
BUN: 14 mg/dL (ref 6–20)
CO2: 26 mmol/L (ref 22–32)
Calcium: 8.1 mg/dL — ABNORMAL LOW (ref 8.9–10.3)
Chloride: 106 mmol/L (ref 98–111)
Creatinine, Ser: 1.14 mg/dL (ref 0.61–1.24)
GFR, Estimated: >60 mL/min (ref >60)
Glucose, Bld: 111 mg/dL — ABNORMAL HIGH (ref 70–99)
Potassium: 2.9 mmol/L — ABNORMAL LOW (ref 3.5–5.1)
Sodium: 139 mmol/L (ref 135–145)

## 2022-04-17 LAB — TROPONIN I (HIGH SENSITIVITY): Troponin I (High Sensitivity): 3 ng/L (ref <18)

## 2022-04-17 MED ORDER — ACETAMINOPHEN 500 MG PO TABS
1000.0000 mg | ORAL_TABLET | Freq: Once | ORAL | Status: AC
Start: 2022-04-17 — End: 2022-04-17
  Administered 2022-04-17: 1000 mg via ORAL
  Filled 2022-04-17: qty 2

## 2022-04-17 MED ORDER — POTASSIUM CHLORIDE CRYS ER 20 MEQ PO TBCR
60.0000 meq | EXTENDED_RELEASE_TABLET | Freq: Once | ORAL | Status: AC
Start: 2022-04-17 — End: 2022-04-17
  Administered 2022-04-17: 60 meq via ORAL
  Filled 2022-04-17: qty 3

## 2022-04-17 NOTE — Discharge Instructions (Signed)
Return to the ED with any new or worsening signs or symptoms Please isolate yourself for 5 days from symptom onset. Please read attached guides concerning COVID-19, quarantine isolation Please treat your symptoms at home.  Please take ibuprofen for body aches and chills.  Please take Tylenol for headaches and fevers.  You may take these every 6 hours you can also alternate them. Please eat a soft diet.  Please push fluids such as Pedialyte, body armor. Please see attached work note

## 2022-04-17 NOTE — ED Triage Notes (Signed)
Reports fever , chest pain , shortness of breath , nasal congestion, headache , symptoms x 1 day  Took Advil prior to arrival within past 2 hours

## 2022-04-17 NOTE — ED Provider Notes (Signed)
[facility] EMERGENCY DEPARTMENT Provider Note   CSN: [id number] Arrival date & time: 04/17/22  1342     History  Chief Complaint  Patient presents with   Fever    Ross Ryan is a 41 y.o. male with medical history of hypertension, migraines.  Patient presents to ED for evaluation of generalized body aches and chills, chest pain, shortness of breath, congestion.  Patient reports the symptoms began yesterday.  Patient unsure of sick contacts.  Patient has not been vaccinated against COVID-19 or the flu.  Patient denies any fevers, nausea, vomiting, sore throat, cough.  Patient reports chest pain and shortness of breath only present when ambulating.   Fever Associated symptoms: chest pain and congestion   Associated symptoms: no cough, no diarrhea, no nausea, no sore throat and no vomiting        Home Medications Prior to Admission medications   Medication Sig Start Date End Date Taking? Authorizing Provider  SUMAtriptan (IMITREX) 20 MG/ACT nasal spray Place 1 spray (20 mg total) into the nose as needed for migraine or headache. May repeat in 1 hour.  Maximum 2 doses in 24 hours. 04/11/22   [name], Adam R, DO  verapamil (CALAN) 80 MG tablet Take 1 tablet (80 mg total) by mouth 3 (three) times daily. 04/11/22   Drema Dallas, DO  clobetasol ointment (TEMOVATE) 0.05 % Use once daily only on affected areas on right lower leg. Do not use on normal skin. 11/01/21   Shelva Majestic, MD  omeprazole (PRILOSEC) 40 MG capsule TAKE ONE CAPSULE BY MOUTH EVERY MORNING BEFORE BREAKFAST 08/15/21   Shelva Majestic, MD  predniSONE (DELTASONE) 20 MG tablet Take 3 tablets daily for 3 days, then 2 tablets daily for 3 days, then 1 tablet daily for 3 days, then stop 04/02/22   Drema Dallas, DO      Allergies    Patient has no known allergies.    Review of Systems   Review of Systems  Constitutional:  Positive for fever.  HENT:  Positive for congestion. Negative for sore throat.    Respiratory:  Positive for shortness of breath. Negative for cough.   Cardiovascular:  Positive for chest pain.  Gastrointestinal:  Negative for diarrhea, nausea and vomiting.  All other systems reviewed and are negative.   Physical Exam Updated Vital Signs BP (!) 145/82 (BP Location: Right Arm)   Pulse (!) 102   Temp (!) 100.4 F (38 C) (Oral)   Resp (!) 31   Ht 6\' 4"  (1.93 m)   Wt 98 kg   SpO2 100%   BMI 26.29 kg/m  Physical Exam Vitals and nursing note reviewed.  Constitutional:      General: He is not in acute distress.    Appearance: He is well-developed.  HENT:     Head: Normocephalic and atraumatic.  Eyes:     Conjunctiva/sclera: Conjunctivae normal.  Cardiovascular:     Rate and Rhythm: Regular rhythm. Tachycardia present.     Heart sounds: No murmur heard. Pulmonary:     Effort: Pulmonary effort is normal. No respiratory distress.     Breath sounds: Normal breath sounds.  Abdominal:     Palpations: Abdomen is soft.     Tenderness: There is no abdominal tenderness.  Musculoskeletal:        General: No swelling.     Cervical back: Neck supple.     Right lower leg: No edema.     Left lower leg:  No edema.  Skin:    General: Skin is warm and dry.     Capillary Refill: Capillary refill takes less than 2 seconds.  Neurological:     Mental Status: He is alert and oriented to person, place, and time.  Psychiatric:        Mood and Affect: Mood normal.     ED Results / Procedures / Treatments   Labs (all labs ordered are listed, but only abnormal results are displayed) Labs Reviewed  RESP PANEL BY RT-PCR (FLU A&B, COVID) ARPGX2 - Abnormal; Notable for the following components:      Result Value   SARS Coronavirus 2 by RT PCR POSITIVE (*)    All other components within normal limits  CBC - Abnormal; Notable for the following components:   Hemoglobin 12.0 (*)    HCT 37.3 (*)    All other components within normal limits  BASIC METABOLIC PANEL - Abnormal;  Notable for the following components:   Potassium 2.9 (*)    Glucose, Bld 111 (*)    Calcium 8.1 (*)    All other components within normal limits  TROPONIN I (HIGH SENSITIVITY)    EKG None  Radiology DG Chest Portable 1 View  Result Date: 04/17/2022 CLINICAL DATA:  Shortness of breath and chest pain EXAM: PORTABLE CHEST 1 VIEW COMPARISON:  04/29/2018 FINDINGS: The lungs are clear without focal pneumonia, edema, pneumothorax or pleural effusion. The cardiopericardial silhouette is within normal limits for size. The visualized bony structures of the thorax are unremarkable. IMPRESSION: No active disease. Electronically Signed   By: Kennith Center M.D.   On: 04/17/2022 14:38    Procedures Procedures   Medications Ordered in ED Medications  acetaminophen (TYLENOL) tablet 1,000 mg (1,000 mg Oral Given 04/17/22 1409)  potassium chloride SA (KLOR-CON M) CR tablet 60 mEq (60 mEq Oral Given 04/17/22 1518)    ED Course/ Medical Decision Making/ A&P                           Medical Decision Making Amount and/or Complexity of Data Reviewed Labs: ordered. Radiology: ordered.  Risk OTC drugs. Prescription drug management.   41 year old male presents to ED for evaluation.  Please see HPI for further details.  On examination the patient is febrile with 100.4, tachycardic.  Patient lung sounds clear bilaterally, he is not hypoxic.  The patient abdomen is soft and compressible throughout.  Patient neurological examination shows no focal neurodeficits.  Due to patient complaint we will proceed with the following labs to include CBC, BMP, troponin, chest x-ray, EKG.  We will also collect a viral sample for testing.  Patient BMP significant for decreased potassium to 2.9 which was repleted with 60 mEq of oral potassium.  Patient CBC unremarkable.  Patient troponin 3, no delta will be collected.  The patient denies any active chest pain at this time, his EKG is nonischemic and reassuring.  The  patient chest x-ray shows no active disease, I agree with radiologist to rotation.  Patient respiratory panel is significant for positive coronavirus test.  Patient provided 1 g Tylenol for fever.  The patient will be discharged home at this time and advised to treat his symptoms conservatively.  The patient was advised to quarantine for 5 days from symptom onset.  Patient provided with literature that instructions on how to quarantine.  Patient stable for discharge.  Final Clinical Impression(s) / ED Diagnoses Final diagnoses:  COVID-19  Rx / DC Orders ED Discharge Orders     None         Al Decant, PA-C 04/17/22 1639    Franne Forts, DO 04/24/22 1242

## 2022-04-22 ENCOUNTER — Encounter: Payer: Self-pay | Admitting: Neurology

## 2022-04-28 ENCOUNTER — Encounter: Payer: Self-pay | Admitting: Neurology

## 2022-04-29 ENCOUNTER — Ambulatory Visit
Admission: RE | Admit: 2022-04-29 | Discharge: 2022-04-29 | Disposition: A | Discharge status: Home / Self Care | Payer: Medicaid Other | Source: Ambulatory Visit | Attending: Neurology | Admitting: Neurology

## 2022-04-29 DIAGNOSIS — I720 Aneurysm of carotid artery: Secondary | ICD-10-CM | POA: Diagnosis not present

## 2022-04-29 DIAGNOSIS — J352 Hypertrophy of adenoids: Secondary | ICD-10-CM | POA: Diagnosis not present

## 2022-04-29 DIAGNOSIS — I729 Aneurysm of unspecified site: Secondary | ICD-10-CM

## 2022-04-29 MED ORDER — IOPAMIDOL (ISOVUE-370) INJECTION 76%
75.0000 mL | Freq: Once | INTRAVENOUS | Status: AC | PRN
  Administered 2022-04-29: 75 mL via INTRAVENOUS

## 2022-05-01 ENCOUNTER — Telehealth: Payer: Self-pay

## 2022-05-01 ENCOUNTER — Telehealth: Payer: Self-pay | Admitting: Neurology

## 2022-05-01 NOTE — Telephone Encounter (Signed)
Results given.

## 2022-05-01 NOTE — Telephone Encounter (Signed)
Pt called in wanting to see if his MRI results were back.

## 2022-05-01 NOTE — Telephone Encounter (Signed)
-----   Message from Drema Dallas, DO sent at 04/30/2022 11:47 AM EST ----- CTA doesn't provide more clarity.  It is either a small outpouching or small aneurysm.  This is an incidental finding and not the cause of his headaches.  I would like to repeat an MRA of head in 6 months in order to monitor.

## 2022-05-01 NOTE — Telephone Encounter (Signed)
Patient advised of his CTA results. Per Patient he would to have Dr. [name] give him a call he has a lot questions and feels like he not given enough explanation  from the Staff.   Advised patient he could send a mychart message to [facility] for any questions he may have and dr.Jaffe will be able to answer it.  Patient declined he prefer for a call or a VV to go over his questions.

## 2022-05-02 ENCOUNTER — Ambulatory Visit (INDEPENDENT_AMBULATORY_CARE_PROVIDER_SITE_OTHER): Payer: Medicaid Other | Admitting: Family Medicine

## 2022-05-02 ENCOUNTER — Encounter: Payer: Self-pay | Admitting: Family Medicine

## 2022-05-02 VITALS — BP 132/82 | HR 68 | Temp 98.5°F | Ht 76.0 in | Wt 214.0 lb

## 2022-05-02 DIAGNOSIS — I10 Essential (primary) hypertension: Secondary | ICD-10-CM | POA: Diagnosis not present

## 2022-05-02 DIAGNOSIS — M25561 Pain in right knee: Secondary | ICD-10-CM

## 2022-05-02 DIAGNOSIS — R0683 Snoring: Secondary | ICD-10-CM | POA: Diagnosis not present

## 2022-05-02 DIAGNOSIS — G8929 Other chronic pain: Secondary | ICD-10-CM

## 2022-05-02 DIAGNOSIS — K219 Gastro-esophageal reflux disease without esophagitis: Secondary | ICD-10-CM | POA: Diagnosis not present

## 2022-05-02 NOTE — Telephone Encounter (Signed)
Pt is sch for 05-05-22 with Jaffe as a VV

## 2022-05-02 NOTE — Progress Notes (Unsigned)
Virtual Visit via Video Note  Consent was obtained for video visit:  Yes.   Answered questions that patient had about telehealth interaction:  Yes.   I discussed the limitations, risks, security and privacy concerns of performing an evaluation and management service by telemedicine. I also discussed with the patient that there may be a patient responsible charge related to this service. The patient expressed understanding and agreed to proceed.  Pt location: Home Physician Location: office Name of referring provider:  Shelva Majestic, MD I connected with Ross Ryan at patients initiation/request on 05/05/2022 at 10:30 AM EST by video enabled telemedicine application and verified that I am speaking with the correct person using two identifiers. Pt MRN:  [id number] Pt DOB:  December 03, 1980 Video Participants:  Ross Ryan;  Assessment/Plan:    Cluster headaches, improved Small cerebral aneurysm - incidental finding   Verapamil 80mg  three times daily Sumatriptan 20mg  NS if needed. Would repeat MRA of head in 6 months Follow up with me about a week after the repeat MRA   Subjective:  Ross Ryan is a 41 year old right-handed male with HTN and depression who follows up for cluster headache.  MRI/MRA and CTA personally reviewed.   UPDATE: Prescribed prednisone to break cluster.  Continued to have headaches.  Started on verapamil.  Headaches have stopped.  He hasn't had a headache in a few weeks.    MRI brain with and without contrast on 04/10/2022 showed chronic opacification of mastoid air cells and petrous apex bilaterally but normal brain.  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  Follow up CTA of head on 04/29/2022 was not able to clarify finding, whether infundibulum vs small aneurysm.    Frequency of abortive medication: none Current NSAIDS/analgesics:  ibuprofen 600mg  Current triptans:  sumatriptan  20mg  NS Current ergotamine:  none Current anti-emetic:  none Current muscle relaxants:  none Current Antihypertensive medications:  verapamil 80mg  TID Current Antidepressant medications:  none Current Anticonvulsant medications:  none Current anti-CGRP:  none Current Vitamins/Herbal/Supplements:  none Current Antihistamines/Decongestants:  none Other therapy:  none Hormone/birth control:  none     HISTORY:  Started in June 2022.  Severe shooting pain that starts at the right TMJ and travels up behind the right eye, temple and behind the ear.  Symptoms similar to when he previously had mastoiditis.  Took antibiotics which was ineffective..  Saw the dentist and underwent root canal which was ineffective.  He has associated blurred vision in right eye, right eye lacrimation, right-sided rhinorrhea, and photophobia.  No nausea, vomiting, ptosis, conjunctival injection, speech disturbance, numbness or weakness.  It lasts 1 hour with sumatriptan 100mg  tablet, otherwise up to 3 hours.  It occurs 1 to 2 times a day.  When he has it, he cannot keep still (paces, rocks back and forth).  Alcohol and marijuana smoke are triggers.  Nothing really relieves it.  He went to the ED on 11/15/2020 and 11/22/2020 for this.  CT head on 11/22/2020 personally reviewed showed bilateral opacification of the bilateral mastoids but no acute findings.  He was told that he likely has migraines.  He denies prior history of migraines.    Past NSAIDS/analgesics:  meloxicam, naproxen Past abortive triptans:  sumatriptan 100mg  Past abortive ergotamine:  none Past muscle relaxants:  Flexeril, Robaxin Past anti-emetic:  Reglan Past antihypertensive medications:  verapamil (helped), amlodipine Past antidepressant medications:  fluoxetine, mirtazpine Past anticonvulsant  medications:  none Past anti-CGRP:  none Past vitamins/Herbal/Supplements:  none Past antihistamines/decongestants:  Flonase Other past therapies:  none     Past Medical History: Past Medical History:  Diagnosis Date   Hypertension 12/2016   Migraines     Medications: Outpatient Encounter Medications as of 05/05/2022  Medication Sig   clobetasol ointment (TEMOVATE) 0.05 % Use once daily only on affected areas on right lower leg. Do not use on normal skin.   omeprazole (PRILOSEC) 40 MG capsule TAKE ONE CAPSULE BY MOUTH EVERY MORNING BEFORE BREAKFAST   predniSONE (DELTASONE) 20 MG tablet Take 3 tablets daily for 3 days, then 2 tablets daily for 3 days, then 1 tablet daily for 3 days, then stop   SUMAtriptan (IMITREX) 20 MG/ACT nasal spray Place 1 spray (20 mg total) into the nose as needed for migraine or headache. May repeat in 1 hour.  Maximum 2 doses in 24 hours.   verapamil (CALAN) 80 MG tablet Take 1 tablet (80 mg total) by mouth 3 (three) times daily.   No facility-administered encounter medications on file as of 05/05/2022.    Allergies: No Known Allergies  Family History: Family History  Problem Relation Age of Onset   Hypertension Mother    Heart disease Mother        in her 10s   Diabetes Father    Hyperlipidemia Father    Hypertension Father    Healthy Sister    Healthy Brother    Dementia Maternal Grandmother    Other Maternal Grandfather        doesnt know cause of death   Prostate cancer Maternal Grandfather        68s   Heart disease Paternal Grandmother    Other Paternal Grandfather        doesnt know cause of death   Prostate cancer Maternal Uncle        in 1s   Prostate cancer Cousin        in 34s    Observations/Objective:   No acute distress.  Alert and oriented.  Speech fluent and not dysarthric.  Language intact.  Eyes orthophoric on primary gaze.  Face symmetric.   Follow Up Instructions:    -I discussed the assessment and treatment plan with the patient. The patient was provided an opportunity to ask questions and all were answered. The patient agreed with the plan and demonstrated an  understanding of the instructions.   The patient was advised to call back or seek an in-person evaluation if the symptoms worsen or if the condition fails to improve as anticipated.   Cira Servant, DO

## 2022-05-02 NOTE — Patient Instructions (Addendum)
-  We will call you within two weeks about your referral to [facility] AND pulmonology to consider sleep studies. If you do not hear within 2 weeks, give [place] a call.   Recommended follow up: Return in about 27 weeks (around 11/07/2022) for physical or sooner if needed.Schedule b4 you leave.

## 2022-05-02 NOTE — Progress Notes (Signed)
Phone [phone] In person visit   Subjective:   Ross Ryan is a 41 y.o. year old very pleasant male patient who presents for/with See problem oriented charting Chief Complaint  Patient presents with   Follow-up    Pt states he has a few things he would like to discuss   Past Medical History-  Patient Active Problem List   Diagnosis Date Noted   Cluster headache 05/03/2021    Priority: Medium    GAD (generalized anxiety disorder) 05/03/2021    Priority: Medium    Gastroesophageal reflux disease without esophagitis 11/02/2018    Priority: Medium    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/05/2018    Priority: Medium    Hypertension 07/05/2018    Priority: Medium    Throat irritation 07/05/2018    Priority: Low   Weight loss 07/05/2018    Priority: Low   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 Do not use on normal skin. 60 g 1   omeprazole (PRILOSEC) 40 MG capsule TAKE ONE CAPSULE BY MOUTH EVERY MORNING BEFORE BREAKFAST 90 capsule 3   predniSONE (DELTASONE) 20 MG tablet Take 3 tablets daily for 3 days, then 2 tablets daily for 3 days, then 1 tablet daily for 3 days, then stop 18 tablet 0   SUMAtriptan (IMITREX) 20 MG/ACT nasal spray Place 1 spray (20 mg total) into the nose as needed for migraine or headache. May repeat in 1 hour.  Maximum 2 doses in 24 hours. 6 each 5   verapamil (CALAN) 80 MG tablet Take 1 tablet (80 mg total) by mouth 3 (three) times daily. 90 tablet 5   No current facility-administered medications for this visit.     Objective:  BP 132/82   Pulse 68   Temp  98.5 F (36.9 C) (Temporal)   Ht 6\' 4"  (1.93 m)   Wt 214 lb (97.1 kg)   SpO2 100%   BMI 26.05 kg/m  Gen: NAD, resting comfortably CV: RRR no murmurs rubs or gallops Lungs: CTAB no crackles, wheeze, rhonchi Ext: no edema Skin: warm, dry      Assessment and Plan   #MRI results- we reviewed today- possible aneurysm to be checked q6 months but not definitevly an aneurysm- sees Ross . Ongoing headaches despite finishing prednisone and now taking verapamil- can follow up with Ross. Everlena Ryan next week to consider next steps -asked him to avoid advil and follow up early next week  #Snoring/pauses in breathing- GF mentioned that he pauses his breathing and snores heavily- will refer to pulmonology for sleep study . Wonder if could contribute to heaches- reports more common at night  #Right Knee- ongoing pain for a yeareven with advil and prednisone using for headaches- he would like to see orthopedics or sports medicine . Knee brace helps some- a lot of crepitus noted. Tried to do treadmil but had significant pain -We will call you within two weeks about your referral to [facility]. If you do not hear within 2 weeks, give Ross Ryan a call.   #hypertension S: medication: verapamil 80 mg TID  for headaches - but also can help BP Home readings #s: no recent checks -weight down 1 lb from last visit -big argument before visit- feels raised BP BP Readings from Last 3 Encounters:  05/02/22 132/82  04/17/22 (!) 145/82  04/02/22 132/79  A/P: blood pressure higher acceptable range- prefer <130/80 if possible but had some big stressors prior to visit- wants to hold off on medication change- continue current medications with verapamil 3x a day  # GERD S:Medication: omeprazole 40mg   - when came off had no obvious reflux but had more gas- when he restarted symptoms resolved- wants to stay on for now A/P: Controlled. Continue current medications.   Recommended follow up: Return in about 27 weeks  (around 11/07/2022) for physical or sooner if needed.Schedule b4 you leave. Future Appointments  Date Time Provider Department Center  05/05/2022 10:30 AM 14/03/2022, DO [facility] None  08/05/2022 10:10 AM 10/05/2022, DO [facility] None   Lab/Order associations:   ICD-10-CM   1. Chronic pain of right knee  M25.561 Ambulatory referral to Sports Medicine   G89.29     2. Primary hypertension  I10     3. Gastroesophageal reflux disease without esophagitis  K21.9     4. Snoring  R06.83 Ambulatory referral to Pulmonology     Return precautions advised.  04-20-1997, MD

## 2022-05-05 ENCOUNTER — Encounter: Payer: Self-pay | Admitting: Neurology

## 2022-05-05 ENCOUNTER — Telehealth (INDEPENDENT_AMBULATORY_CARE_PROVIDER_SITE_OTHER): Payer: Medicaid Other | Admitting: Neurology

## 2022-05-05 DIAGNOSIS — I671 Cerebral aneurysm, nonruptured: Secondary | ICD-10-CM

## 2022-05-05 DIAGNOSIS — G44009 Cluster headache syndrome, unspecified, not intractable: Secondary | ICD-10-CM

## 2022-05-05 NOTE — Patient Instructions (Signed)
Continue verapamil 80mg  three times daily Sumatriptan nasal spray as needed.  Limit use of pain relievers to no more than 2 days out of week to prevent risk of rebound or medication-overuse headache. Repeat MRA of head in 6 months Follow up after repeat MRA

## 2022-05-06 NOTE — Progress Notes (Unsigned)
I, [name], LAT, ATC acting as a scribe for Ross Graham, MD.  Subjective:    CC: R knee pain  HPI: Pt is a 41 y/o male c/o R knee pain going for about 2 years. Pt c/o not being able to run comfortably, w/ mechanical symptoms, and instability. Pt locates pain to the anterior and lateral aspect of the R knee.  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  However for recreation he lifts weights and does cardiovascular exercise.  He will play basketball recreationally and notes that he feels shifting and catching in his knee.  He denies any known knee injury to explain his pain.  R Knee swelling: yes Mechanical symptoms: yes Aggravates: activity, running Treatments tried: knee brace, IBU   Pertinent review of Systems: No fevers or chills  Relevant historical information: Cluster headaches   Objective:    Vitals:   05/07/22 1042  BP: 128/84  Pulse: 72  SpO2: 98%   General: Well Developed, well nourished, and in no acute distress.   MSK: Right knee no effusion Normal-appearing otherwise Normal knee motion with significant crepitation. Tender palpation medial joint line. Stable ligamentous exam however some guarding is present with anterior and posterior drawer testing. Positive medial McMurray's test. Intact strength.  Lab and Radiology Results  Procedure: Real-time Ultrasound Guided Injection of right knee superior lateral patellar space Device: Philips Affiniti 50G Images permanently stored and available for review in PACS Verbal informed consent obtained.  Discussed risks and benefits of procedure. Warned about infection, bleeding, hyperglycemia damage to structures among others. Patient expresses understanding and agreement Time-out conducted.   Noted no overlying erythema, induration, or other signs of local infection.   Skin prepped in a sterile fashion.   Local anesthesia: Topical Ethyl chloride.   With sterile technique and under real time ultrasound  guidance: 40 mg of Kenalog and 2 mL of Marcaine injected into knee joint. Fluid seen entering the joint capsule.   Completed without difficulty   Pain immediately resolved suggesting accurate placement of the medication.   Advised to call if fevers/chills, erythema, induration, drainage, or persistent bleeding.   Images permanently stored and available for review in the ultrasound unit.  Impression: Technically successful ultrasound guided injection.    X-ray images right knee obtained today personally and independently interpreted Moderate medial compartment and patellofemoral DJD.  No acute fractures. Await formal radiology review   Impression and Recommendations:    Assessment and Plan: 41 y.o. male with right knee pain with instability with athletic activity.  Patient has much more arthritis than I would expect for a man of his age and health.  I am concerned he at some point in the past had an ACL tear or other significant injury impacting the stability of his knee that is contributed to the arthritis that he is has in his knee now that is causing a lot of the pain that he is currently experiencing. Plan for injection today but will proceed to MRI to further evaluate the possibility of ACL or meniscus tear in his car.  Into the stability of his knee.  He may be a surgical candidate.  He wishes to continue playing basketball.  PDMP not reviewed this encounter. Orders Placed This Encounter  Procedures   [place] LIMITED JOINT SPACE STRUCTURES LOW RIGHT(NO LINKED CHARGES)    Order Specific Question:   Reason for Exam (SYMPTOM  OR DIAGNOSIS REQUIRED)    Answer:   right knee pain    Order  Specific Question:   Preferred imaging location?    Answer:    [facility] Knee AP/LAT W/Sun[place] Right    Standing Status:   Future    Number of Occurrences:   1    Standing Expiration Date:   06/07/2022    Order Specific Question:   Reason for Exam (SYMPTOM  OR DIAGNOSIS  REQUIRED)    Answer:   right knee pain    Order Specific Question:   Preferred imaging location?    Answer:   [facility]   MR Knee Right Wo Contrast    Standing Status:   Future    Standing Expiration Date:   05/08/2023    Order Specific Question:   What is the patient's sedation requirement?    Answer:   No Sedation    Order Specific Question:   Does the patient have a pacemaker or implanted devices?    Answer:   No    Order Specific Question:   Preferred imaging location?    Answer:   [facility] (table limit-350lbs)   No orders of the defined types were placed in this encounter.   Discussed warning signs or symptoms. Please see discharge instructions. Patient expresses understanding.   The above documentation has been reviewed and is accurate and complete Ross Ryan, M.D.

## 2022-05-07 ENCOUNTER — Ambulatory Visit: Payer: Medicaid Other | Admitting: Family Medicine

## 2022-05-07 ENCOUNTER — Ambulatory Visit: Payer: Self-pay

## 2022-05-07 ENCOUNTER — Ambulatory Visit (INDEPENDENT_AMBULATORY_CARE_PROVIDER_SITE_OTHER): Payer: Medicaid Other

## 2022-05-07 ENCOUNTER — Encounter: Payer: Self-pay | Admitting: Family Medicine

## 2022-05-07 VITALS — BP 128/84 | HR 72 | Ht 76.0 in | Wt 218.0 lb

## 2022-05-07 DIAGNOSIS — M25561 Pain in right knee: Secondary | ICD-10-CM

## 2022-05-07 DIAGNOSIS — G8929 Other chronic pain: Secondary | ICD-10-CM | POA: Diagnosis not present

## 2022-05-07 DIAGNOSIS — M25361 Other instability, right knee: Secondary | ICD-10-CM

## 2022-05-07 NOTE — Patient Instructions (Addendum)
Thank you for coming in today.   You received an injection today. Seek immediate medical attention if the joint becomes red, extremely painful, or is oozing fluid.   Please use Voltaren gel (Generic Diclofenac Gel) up to 4x daily for pain as needed.  This is available over-the-counter as both the name brand Voltaren gel and the generic diclofenac gel.   You should hear from MRI scheduling within 1 week. If you do not hear please let me know.    Based on results I may get you right to a surgeon to talk about options.

## 2022-05-08 NOTE — Progress Notes (Signed)
Right knee x-ray shows some arthritis changes

## 2022-05-11 ENCOUNTER — Other Ambulatory Visit: Payer: Medicaid Other

## 2022-05-27 ENCOUNTER — Ambulatory Visit
Admission: EM | Admit: 2022-05-27 | Discharge: 2022-05-27 | Discharge status: Against Medical Advice | Payer: Medicaid Other

## 2022-05-27 ENCOUNTER — Ambulatory Visit: Admit: 2022-05-27 | Discharge status: Absent without leave | Payer: Medicaid Other

## 2022-05-27 ENCOUNTER — Encounter: Payer: Self-pay | Admitting: Family Medicine

## 2022-05-27 ENCOUNTER — Ambulatory Visit
Admission: EM | Admit: 2022-05-27 | Discharge: 2022-05-27 | Disposition: A | Discharge status: Home / Self Care | Payer: Medicaid Other | Attending: Family Medicine | Admitting: Family Medicine

## 2022-05-27 DIAGNOSIS — Z202 Contact with and (suspected) exposure to infections with a predominantly sexual mode of transmission: Secondary | ICD-10-CM | POA: Diagnosis not present

## 2022-05-27 NOTE — ED Provider Notes (Addendum)
[name]    CSN: [id number] Arrival date & time: 05/27/22  1746      History   Chief Complaint Chief Complaint  Patient presents with   SEXUALLY TRANSMITTED DISEASE    testing    HPI Ross Ryan is a 42 y.o. male.   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 Authorizing Provider  clobetasol ointment (TEMOVATE) 0.05 % Use once daily only on affected areas on right lower leg. Do not use on normal skin. 11/01/21   Marin Olp, MD  omeprazole (PRILOSEC) 40 MG capsule TAKE ONE CAPSULE BY MOUTH EVERY MORNING BEFORE BREAKFAST 08/15/21   Marin Olp, MD  SUMAtriptan [facility]) 20 MG/ACT nasal spray Place 1 spray (20 mg total) into the nose as needed for migraine or headache. May repeat in 1 hour.  Maximum 2 doses in 24 hours. 04/11/22   Tomi Likens, Adam R, DO  verapamil (CALAN) 80 MG tablet Take 1 tablet (80 mg total) by mouth 3 (three) times daily. 04/11/22   Pieter Partridge, DO    Family History Family History  Problem Relation Age of Onset   Hypertension  Mother    Heart disease Mother        in her 54s   Diabetes Father    Hyperlipidemia Father    Hypertension Father    Healthy Sister    Healthy Brother    Dementia Maternal Grandmother    Other Maternal Grandfather        doesnt know cause of death   Prostate cancer Maternal Grandfather        68s   Heart disease Paternal Grandmother    Other Paternal Grandfather        doesnt know cause of death   Prostate cancer Maternal Uncle        in 60s   Prostate cancer Cousin        in 19s    Social History Social History   Tobacco Use   Smoking status: Former    Types: Cigarettes   Smokeless tobacco: Never   Tobacco comments:    smokes cigars socially- encouraged cessation  Vaping Use   Vaping Use: Never used  Substance Use Topics   Alcohol use: Yes    Comment: social   Drug use: Not Currently    Types: Marijuana     Allergies   Patient has no known allergies.   Review of Systems Review of Systems  See HPI Physical Exam Triage Vital Signs ED Triage Vitals [05/27/22 1847]  Enc Vitals Group     BP (!) 164/95     Pulse Rate 77     Resp 12     Temp 98.3 F (36.8 C)     Temp Source Oral     SpO2 99 %     Weight      Height      Head Circumference      Peak Flow      Pain Score      Pain Loc      Pain Edu?      Excl. in [place]?    No data found.  Updated Vital Signs BP (!) 164/95 (BP Location: Left Arm)   Pulse 77   Temp 98.3 F (36.8 C) (Oral)   Resp 12   SpO2 99%   Physical Exam Constitutional:      General: He is not in acute distress.    Appearance: He is well-developed.  HENT:     Head: Normocephalic and atraumatic.  Eyes:     Conjunctiva/sclera: Conjunctivae normal.     Pupils: Pupils are equal, round, and reactive to light.  Cardiovascular:     Rate and Rhythm: Normal rate.  Pulmonary:     Effort: Pulmonary effort is normal. No respiratory distress.  Musculoskeletal:        General: Normal range of motion.     Cervical back: Normal  range of motion.  Skin:    General: Skin is warm and dry.  Neurological:     Mental Status: He is alert.      UC Treatments / Results  Labs (all labs ordered are listed, but only abnormal results are displayed) Labs Reviewed  RPR  HIV ANTIBODY (ROUTINE TESTING W REFLEX)  CYTOLOGY, (ORAL, ANAL, URETHRAL) ANCILLARY ONLY    EKG   Radiology No results found.  Procedures Procedures (including critical care time)  Medications Ordered in UC Medications - No data to display  Initial Impression / Assessment and Plan / UC Course  I have reviewed the triage vital signs and the nursing notes.  Pertinent labs & imaging results that were available during my care of the patient were reviewed by me and considered in my medical decision making (see chart for details).     Discussed STD testing.  Results. Final Clinical Impressions(s) / UC Diagnoses   Final diagnoses:  Possible exposure to STD     Discharge Instructions      The results will be available on My Chart No sexual encounter until testing and any treatment complete You will be called if any tests are pos   ED Prescriptions   None    PDMP not reviewed this encounter.   Raylene Everts, MD 05/27/22 Docia Chuck    Raylene Everts, MD 06/01/22 [phone]

## 2022-05-27 NOTE — Discharge Instructions (Signed)
The results will be available on My Chart No sexual encounter until testing and any treatment complete You will be called if any tests are pos

## 2022-05-27 NOTE — ED Triage Notes (Signed)
Pt presents with c/o waning STD testing to include blood work. Pt denies sx but endorses having sex with a new partner a few weeks ago and the condom broke.

## 2022-05-29 ENCOUNTER — Institutional Professional Consult (permissible substitution): Payer: Medicaid Other | Admitting: Nurse Practitioner

## 2022-05-29 LAB — RPR: RPR Ser Ql: NONREACTIVE

## 2022-05-29 LAB — CYTOLOGY, (ORAL, ANAL, URETHRAL) ANCILLARY ONLY
Chlamydia: NEGATIVE
Comment: NEGATIVE
Comment: NORMAL
Neisseria Gonorrhea: NEGATIVE

## 2022-05-29 LAB — HIV ANTIBODY (ROUTINE TESTING W REFLEX): HIV 1&2 Ab, 4th Generation: NONREACTIVE

## 2022-05-30 ENCOUNTER — Encounter: Payer: Self-pay | Admitting: Nurse Practitioner

## 2022-05-30 ENCOUNTER — Ambulatory Visit (INDEPENDENT_AMBULATORY_CARE_PROVIDER_SITE_OTHER): Payer: Medicaid Other | Admitting: Nurse Practitioner

## 2022-05-30 VITALS — BP 132/86 | HR 80 | Ht 76.0 in | Wt 219.0 lb

## 2022-05-30 DIAGNOSIS — R0683 Snoring: Secondary | ICD-10-CM | POA: Diagnosis not present

## 2022-05-30 NOTE — Patient Instructions (Addendum)
Given your symptoms, I am concerned that you may have sleep disordered breathing with sleep apnea. You will need a sleep study for further evaluation. Someone will contact you for scheduling.  We discussed how untreated sleep apnea puts an individual at risk for cardiac arrhthymias, pulm HTN, DM, stroke and increases their risk for daytime accidents. We also briefly reviewed treatment options including weight loss, side sleeping position, oral appliance, CPAP therapy or referral to ENT for possible surgical options  Caution on safe driving practices  Follow up in 12-14 weeks with Ross Jersey Anaclara Acklin,NP to discuss results or sooner, if needed

## 2022-05-30 NOTE — Progress Notes (Signed)
@Patient  ID: Ross Ryan, male    DOB: 24-Sep-1980, 42 y.o.   MRN: [id number]  Chief Complaint  Patient presents with   Consult    Pt sleep consult, he reports snoring having witnessed apnease. Getting approx 6hr/night of sleep. No past sleep studies or CPAP use    Referring provider: Marin Olp, MD  HPI: 42 year old  male, former smoker referred for sleep consult. Past medical history significant for HTN, GERD, lichen simplex chronicus, cluster headaches.   TEST/EVENTS:   05/30/2022: Today - sleep consult Patient presents today for sleep consult, referred by Dr. Yong Channel. He has longstanding issues with loud snoring and has been told that he stops breathing when he sleeps at night. He does have some occasional daytime fatigue. Usually wakes feeling rested unless he was out late the night before. He has dry mouth in the morning. He has had issues with drowsy driving before, but again only if he has not had much sleep the night before. He denies any morning headaches, sleep parasomnias/paralysis. No history of narcolepsy or cataplexy. He goes to bed around 11 pm-1 am. Falls asleep within 15-30 minutes. Wakes once a night. Officially gets up around 8 am. No sleep aids. 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 Weight has fluctuated up and down over the past two years.  He has a history of hypertension, well-controlled on antihypertensives. No history of stroke. Never smoker. Occasionally drinks alcohol. Lives at home with his kids. Family history of heart disease.   Epworth 11   No Known Allergies  Immunization History  Administered Date(s) Administered   DTaP 02/07/1983, 05/06/1983, 08/22/1983, 04/07/1986, 09/28/1987   Hepatitis A 11/09/2017, 05/12/2018   Hepatitis A, Ped/Adol-2 Dose 11/09/2017, 05/12/2018   Hepatitis B 11/09/2017, 01/08/2018, 05/12/2018   IPV 02/07/1983, 05/06/1983, 08/22/1983, 09/28/1987   MMR 04/16/1984   PPD Test 02/29/2016   Tdap  07/05/2018    Past Medical History:  Diagnosis Date   Hypertension 12/2016   Migraines     Tobacco History: Social History   Tobacco Use  Smoking Status Former   Types: Cigarettes  Smokeless Tobacco Never  Tobacco Comments   smokes cigars socially- encouraged cessation   Counseling given: Not Answered Tobacco comments: smokes cigars socially- encouraged cessation   Outpatient Medications Prior to Visit  Medication Sig Dispense Refill   clobetasol ointment (TEMOVATE) 0.05 % Use once daily only on affected areas on right lower leg. Do not use on normal skin. 60 g 1   omeprazole (PRILOSEC) 40 MG capsule TAKE ONE CAPSULE BY MOUTH EVERY MORNING BEFORE BREAKFAST 90 capsule 3   SUMAtriptan (IMITREX) 20 MG/ACT nasal spray Place 1 spray (20 mg total) into the nose as needed for migraine or headache. May repeat in 1 hour.  Maximum 2 doses in 24 hours. 6 each 5   verapamil (CALAN) 80 MG tablet Take 1 tablet (80 mg total) by mouth 3 (three) times daily. 90 tablet 5   No facility-administered medications prior to visit.     Review of Systems:   Constitutional: No weight loss or gain, night sweats, fevers, chills, or lassitude. +excessive daytime fatigue HEENT: No headaches, difficulty swallowing, tooth/dental problems, or sore throat. No sneezing, itching, ear ache, nasal congestion, or post nasal drip CV:  No chest pain, orthopnea, PND, swelling in lower extremities, anasarca, dizziness, palpitations, syncope Resp: +witnessed apneas, snoring. No shortness of breath with exertion or at rest. No excess mucus or change in color of  mucus. No productive or non-productive. No hemoptysis. No wheezing.  No chest wall deformity GI:  No heartburn, indigestion, abdominal pain, loss of appetite GU: No dysuria, change in color of urine, urgency or frequency, nocturia  Skin: No rash, lesions, ulcerations MSK:  No joint pain or swelling.   Neuro: No dizziness or lightheadedness.  Psych: No  depression or anxiety. Mood stable.     Physical Exam:  BP 132/86   Pulse 80   Ht 6\' 4"  (1.93 m)   Wt 219 lb (99.3 kg)   SpO2 99%   BMI 26.66 kg/m   GEN: Pleasant, interactive, well-appearing; in no acute distress HEENT:  Normocephalic and atraumatic. PERRLA. Sclera Davidson. Nasal turbinates pink, moist and patent bilaterally. No rhinorrhea present. Oropharynx pink and moist, without exudate or edema. No lesions, ulcerations, or postnasal drip. Mallampati II/III NECK:  Supple w/ fair ROM. No JVD present. Normal carotid impulses w/o bruits. Thyroid symmetrical with no goiter or nodules palpated. No lymphadenopathy.   CV: RRR, no m/r/g, no peripheral edema. Pulses intact, +2 bilaterally. No cyanosis, pallor or clubbing. PULMONARY:  Unlabored, regular breathing. Clear bilaterally A&P w/o wheezes/rales/rhonchi. No accessory muscle use.  GI: BS present and normoactive. Soft, non-tender to palpation. No organomegaly or masses detected.  MSK: No erythema, warmth or tenderness. Cap refil <2 sec all extrem. No deformities or joint swelling noted.  Neuro: A/Ox3. No focal deficits noted.   Skin: Warm, no lesions or rashe Psych: Normal affect and behavior. Judgement and thought content appropriate.     Lab Results:  CBC    Component Value Date/Time   WBC 8.5 04/17/2022 1426   RBC 4.61 04/17/2022 1426   HGB 12.0 (L) 04/17/2022 1426   HCT 37.3 (L) 04/17/2022 1426   PLT 197 04/17/2022 1426   MCV 80.9 04/17/2022 1426   MCH 26.0 04/17/2022 1426   MCHC 32.2 04/17/2022 1426   RDW 13.9 04/17/2022 1426   LYMPHSABS 1.9 11/01/2021 1344   MONOABS 0.5 11/01/2021 1344   EOSABS 0.0 11/01/2021 1344   BASOSABS 0.0 11/01/2021 1344    BMET    Component Value Date/Time   NA 139 04/17/2022 1426   K 2.9 (L) 04/17/2022 1426   CL 106 04/17/2022 1426   CO2 26 04/17/2022 1426   GLUCOSE 111 (H) 04/17/2022 1426   BUN 14 04/17/2022 1426   CREATININE 1.14 04/17/2022 1426   CREATININE 1.31 01/05/2020  0941   CALCIUM 8.1 (L) 04/17/2022 1426   GFRNONAA >60 04/17/2022 1426   GFRNONAA 69 01/05/2020 0941   GFRAA 80 01/05/2020 0941    BNP No results found for: "BNP"   Imaging:  DG Knee AP/LAT W/Sunrise Right  Result Date: 05/07/2022 CLINICAL DATA:  right knee pain EXAM: RIGHT KNEE 3 VIEWS COMPARISON:  None Available. FINDINGS: No evidence of fracture, dislocation, or joint effusion. There is medial and patellofemoral joint space narrowing and osteophytes consistent with degenerative joint disease. No evidence of effusion. IMPRESSION: Degenerative changes. No acute osseous abnormalities. Electronically Signed   By: Sammie Bench M.D.   On: 05/07/2022 20:37          No data to display          No results found for: "NITRICOXIDE"      Assessment & Plan:   Loud snoring He has snoring, daytime fatigue, nocturnal apneic events, morning dry mouth. Epworth 11. Given this,  I am concerned he could have sleep disordered breathing with obstructive sleep apnea. He will need sleep study for  further evaluation.    - discussed how weight can impact sleep and risk for sleep disordered breathing - discussed options to assist with weight loss: combination of diet modification, cardiovascular and strength training exercises   - had an extensive discussion regarding the adverse health consequences related to untreated sleep disordered breathing - specifically discussed the risks for hypertension, coronary artery disease, cardiac dysrhythmias, cerebrovascular disease, and diabetes - lifestyle modification discussed   - discussed how sleep disruption can increase risk of accidents, particularly when driving - safe driving practices were discussed  Patient Instructions  Given your symptoms, I am concerned that you may have sleep disordered breathing with sleep apnea. You will need a sleep study for further evaluation. Someone will contact you for scheduling.  We discussed how untreated  sleep apnea puts an individual at risk for cardiac arrhthymias, pulm HTN, DM, stroke and increases their risk for daytime accidents. We also briefly reviewed treatment options including weight loss, side sleeping position, oral appliance, CPAP therapy or referral to ENT for possible surgical options  Caution on safe driving practices  Follow up in 12-14 weeks with Florentina Addison Kirubel Aja,NP to discuss results or sooner, if needed   I spent 35 minutes of dedicated to the care of this patient on the date of this encounter to include pre-visit review of records, face-to-face time with the patient discussing conditions above, post visit ordering of testing, clinical documentation with the electronic health record, making appropriate referrals as documented, and communicating necessary findings to members of the patients care team.  Noemi Chapel, NP 05/30/2022  Pt aware and understands NP's role.

## 2022-05-30 NOTE — Assessment & Plan Note (Signed)
He has snoring, daytime fatigue, nocturnal apneic events, morning dry mouth. Epworth 11. Given this,  I am concerned he could have sleep disordered breathing with obstructive sleep apnea. He will need sleep study for further evaluation.    - discussed how weight can impact sleep and risk for sleep disordered breathing - discussed options to assist with weight loss: combination of diet modification, cardiovascular and strength training exercises   - had an extensive discussion regarding the adverse health consequences related to untreated sleep disordered breathing - specifically discussed the risks for hypertension, coronary artery disease, cardiac dysrhythmias, cerebrovascular disease, and diabetes - lifestyle modification discussed   - discussed how sleep disruption can increase risk of accidents, particularly when driving - safe driving practices were discussed  Patient Instructions  Given your symptoms, I am concerned that you may have sleep disordered breathing with sleep apnea. You will need a sleep study for further evaluation. Someone will contact you for scheduling.  We discussed how untreated sleep apnea puts an individual at risk for cardiac arrhthymias, pulm HTN, DM, stroke and increases their risk for daytime accidents. We also briefly reviewed treatment options including weight loss, side sleeping position, oral appliance, CPAP therapy or referral to ENT for possible surgical options  Caution on safe driving practices  Follow up in 12-14 weeks with Joellen Jersey Icholas Irby,NP to discuss results or sooner, if needed

## 2022-05-30 NOTE — Progress Notes (Signed)
Reviewed and agree with assessment/plan.   Nisa Decaire, MD [facility] 05/30/2022, 11:09 AM Pager:  [phone]  

## 2022-08-04 NOTE — Progress Notes (Unsigned)
NEUROLOGY FOLLOW UP OFFICE NOTE  JASMINE NAKAYAMA [id number]  Assessment/Plan:   Episodic cluster headache, stable Small cerebral aneurysm - incidental finding   If cluster recurs, restart verapamil '80mg'$  3 times daily Sumatriptan '20mg'$  NS if needed. Repeat CTA of head in 3 months. Follow up 6 months.   Subjective:  Ross Ryan is a 42 year old right-handed male with HTN and depression who follows up for cluster headache.  MRI/MRA and CTA personally reviewed.   UPDATE: After 3-4 weeks, he stopped the verapamil as headaches resolved.  He has not had recurrence.   Frequency of abortive medication: none Current NSAIDS/analgesics:  ibuprofen '600mg'$  Current triptans:  sumatriptan '20mg'$  NS Current ergotamine:  none Current anti-emetic:  none Current muscle relaxants:  none Current Antihypertensive medications:  verapamil '80mg'$  TID Current Antidepressant medications:  none Current Anticonvulsant medications:  none Current anti-CGRP:  none Current Vitamins/Herbal/Supplements:  none Current Antihistamines/Decongestants:  none Other therapy:  none Hormone/birth control:  none     HISTORY:  Started in June 2022.  Severe shooting pain that starts at the right TMJ and travels up behind the right eye, temple and behind the ear.  Symptoms similar to when he previously had mastoiditis.  Took antibiotics which was ineffective..  Saw the dentist and underwent root canal which was ineffective.  He has associated blurred vision in right eye, right eye lacrimation, right-sided rhinorrhea, and photophobia.  No nausea, vomiting, ptosis, conjunctival injection, speech disturbance, numbness or weakness.  It lasts 1 hour with sumatriptan '100mg'$  tablet, otherwise up to 3 hours.  It occurs 1 to 2 times a day.  When he has it, he cannot keep still (paces, rocks back and forth).  Alcohol and marijuana smoke are triggers.  Nothing really relieves it.  He went to the ED on 11/15/2020 and 11/22/2020 for this.  CT  head on 11/22/2020 personally reviewed showed bilateral opacification of the bilateral mastoids but no acute findings. MRI brain with and without contrast on 04/10/2022 showed chronic opacification of mastoid air cells and petrous apex bilaterally but normal brain.  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  Follow up CTA of head on 04/29/2022 was not able to clarify finding, whether infundibulum vs small aneurysm.    Past NSAIDS/analgesics:  meloxicam, naproxen Past abortive triptans:  sumatriptan '100mg'$  Past abortive ergotamine:  none Past muscle relaxants:  Flexeril, Robaxin Past anti-emetic:  Reglan Past antihypertensive medications:  verapamil (helped), amlodipine Past antidepressant medications:  fluoxetine, mirtazpine Past anticonvulsant medications:  none Past anti-CGRP:  none Past vitamins/Herbal/Supplements:  none Past antihistamines/decongestants:  Flonase Other past therapies:  none  PAST MEDICAL HISTORY: Past Medical History:  Diagnosis Date   Hypertension 12/2016   Migraines     MEDICATIONS: Current Outpatient Medications on File Prior to Visit  Medication Sig Dispense Refill   clobetasol ointment (TEMOVATE) 0.05 % Use once daily only on affected areas on right lower leg. Do not use on normal skin. 60 g 1   omeprazole (PRILOSEC) 40 MG capsule TAKE ONE CAPSULE BY MOUTH EVERY MORNING BEFORE BREAKFAST 90 capsule 3   SUMAtriptan (IMITREX) 20 MG/ACT nasal spray Place 1 spray (20 mg total) into the nose as needed for migraine or headache. May repeat in 1 hour.  Maximum 2 doses in 24 hours. 6 each 5   verapamil (CALAN) 80 MG tablet Take 1 tablet (80 mg total) by mouth 3 (three) times daily. 90 tablet 5  No current facility-administered medications on file prior to visit.    ALLERGIES: No Known Allergies  FAMILY HISTORY: Family History  Problem Relation Age of Onset   Hypertension Mother    Heart  disease Mother        in her 39s   Diabetes Father    Hyperlipidemia Father    Hypertension Father    Healthy Sister    Healthy Brother    Dementia Maternal Grandmother    Other Maternal Grandfather        doesnt know cause of death   Prostate cancer Maternal Grandfather        47s   Heart disease Paternal Grandmother    Other Paternal Grandfather        doesnt know cause of death   Prostate cancer Maternal Uncle        in 48s   Prostate cancer Cousin        in 73s      Objective:  Blood pressure (!) 143/90, pulse 86, height '6\' 4"'$  (1.93 m), weight 237 lb 6.4 oz (107.7 kg), SpO2 98 %. General: No acute distress.  Patient appears well-groomed.      Metta Clines, DO  CC: Garret Reddish, MD

## 2022-08-05 ENCOUNTER — Encounter: Payer: Self-pay | Admitting: Neurology

## 2022-08-05 ENCOUNTER — Ambulatory Visit: Payer: Medicaid Other | Admitting: Neurology

## 2022-08-05 VITALS — BP 143/90 | HR 86 | Ht 76.0 in | Wt 237.4 lb

## 2022-08-05 DIAGNOSIS — I671 Cerebral aneurysm, nonruptured: Secondary | ICD-10-CM | POA: Diagnosis not present

## 2022-08-05 DIAGNOSIS — G44019 Episodic cluster headache, not intractable: Secondary | ICD-10-CM | POA: Diagnosis not present

## 2022-08-05 NOTE — Patient Instructions (Signed)
Repeat CTA of head in 3 months If headaches return, restart verapamil '80mg'$  three times daily Sumatriptan nasal spray as needed/directed for headache attack Follow up 6 months.

## 2022-08-12 ENCOUNTER — Ambulatory Visit
Admission: RE | Admit: 2022-08-12 | Discharge: 2022-08-12 | Disposition: A | Discharge status: Home / Self Care | Payer: Medicaid Other | Source: Ambulatory Visit | Attending: Nurse Practitioner | Admitting: Nurse Practitioner

## 2022-08-12 VITALS — BP 163/93 | HR 81 | Temp 98.5°F | Resp 18

## 2022-08-12 DIAGNOSIS — Z113 Encounter for screening for infections with a predominantly sexual mode of transmission: Secondary | ICD-10-CM | POA: Diagnosis not present

## 2022-08-12 NOTE — ED Triage Notes (Signed)
Pt requesting STD testing (with PRP/ HIV). Patient denies symptoms.

## 2022-08-12 NOTE — Discharge Instructions (Signed)
The clinic will contact you with results of your testing done today

## 2022-08-12 NOTE — ED Provider Notes (Signed)
[facility]    CSN: [id number] Arrival date & time: 08/12/22  1902      History   Chief Complaint Chief Complaint  Patient presents with   SEXUALLY TRANSMITTED DISEASE    HPI MARKEIL ROMBERGER is a 42 y.o. male presents for evaluation of STD testing.  Patient would like screening and denies any symptoms including dysuria, penile discharge, testicular pain or swelling, fevers.  Denies any known exposure.  No other concerns at this time.  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 Authorizing Provider  clobetasol ointment (TEMOVATE) 0.05 % Use once daily only on affected areas on right lower leg. Do not use on normal skin. 11/01/21   Marin Olp, MD  omeprazole (PRILOSEC) 40 MG capsule TAKE ONE CAPSULE BY MOUTH EVERY MORNING BEFORE BREAKFAST 08/15/21   Marin Olp, MD  SUMAtriptan [facility]) 20 MG/ACT nasal spray Place 1 spray (20 mg total) into the nose as needed for migraine or headache. May repeat in 1 hour.  Maximum 2 doses in 24 hours. 04/11/22   Tomi Likens, Adam R, DO  verapamil (CALAN) 80 MG  tablet Take 1 tablet (80 mg total) by mouth 3 (three) times daily. 04/11/22   Pieter Partridge, DO    Family History Family History  Problem Relation Age of Onset   Hypertension Mother    Heart disease Mother        in her 6s   Diabetes Father    Hyperlipidemia Father    Hypertension Father    Healthy Sister    Healthy Brother    Dementia Maternal Grandmother    Other Maternal Grandfather        doesnt know cause of death   Prostate cancer Maternal Grandfather        41s   Heart disease Paternal Grandmother    Other Paternal Grandfather        doesnt know cause of death   Prostate cancer Maternal Uncle        in 49s   Prostate cancer Cousin        in 19s    Social History Social History   Tobacco Use   Smoking status: Former    Types: Cigarettes   Smokeless tobacco: Never   Tobacco comments:    smokes cigars socially- encouraged cessation  Vaping Use   Vaping Use: Never used  Substance Use Topics   Alcohol use: Yes    Comment: social   Drug use: Not Currently    Types: Marijuana     Allergies   Patient  has no known allergies.   Review of Systems Review of Systems  Genitourinary:        STD screening     Physical Exam Triage Vital Signs ED Triage Vitals  Enc Vitals Group     BP 08/12/22 1907 (!) 163/93     Pulse Rate 08/12/22 1907 81     Resp 08/12/22 1907 18     Temp 08/12/22 1907 98.5 F (36.9 C)     Temp Source 08/12/22 1907 Oral     SpO2 08/12/22 1907 98 %     Weight --      Height --      Head Circumference --      Peak Flow --      Pain Score 08/12/22 1911 0     Pain Loc --      Pain Edu? --      Excl. in [place]? --    No data found.  Updated Vital Signs BP (!) 163/93 (BP Location: Left Arm)   Pulse 81   Temp 98.5 F (36.9 C) (Oral)   Resp 18   SpO2 98%   Visual Acuity Right Eye Distance:   Left Eye Distance:   Bilateral Distance:    Right Eye Near:   Left Eye Near:    Bilateral Near:     Physical Exam Vitals and nursing  note reviewed.  Constitutional:      Appearance: Normal appearance.  HENT:     Head: Normocephalic and atraumatic.  Eyes:     Pupils: Pupils are equal, round, and reactive to light.  Cardiovascular:     Rate and Rhythm: Normal rate.  Pulmonary:     Effort: Pulmonary effort is normal.  Skin:    General: Skin is warm and dry.  Neurological:     General: No focal deficit present.     Mental Status: He is alert and oriented to person, place, and time.  Psychiatric:        Mood and Affect: Mood normal.        Behavior: Behavior normal.      UC Treatments / Results  Labs (all labs ordered are listed, but only abnormal results are displayed) Labs Reviewed  HIV ANTIBODY (ROUTINE TESTING W REFLEX)  RPR  CYTOLOGY, (ORAL, ANAL, URETHRAL) ANCILLARY ONLY    EKG   Radiology No results found.  Procedures Procedures (including critical care time)  Medications Ordered in UC Medications - No data to display  Initial Impression / Assessment and Plan / UC Course  I have reviewed the triage vital signs and the nursing notes.  Pertinent labs & imaging results that were available during my care of the patient were reviewed by me and considered in my medical decision making (see chart for details).     STD testing is ordered and will contact patient for any positive results Follow-up as needed Final Clinical Impressions(s) / UC Diagnoses   Final diagnoses:  Screening examination for STD (sexually transmitted disease)     Discharge Instructions      The clinic will contact you with results of your testing done today    ED Prescriptions   None    PDMP not reviewed this encounter.   Melynda Ripple, NP 08/12/22 1924

## 2022-08-13 LAB — CYTOLOGY, (ORAL, ANAL, URETHRAL) ANCILLARY ONLY
Chlamydia: NEGATIVE
Comment: NEGATIVE
Comment: NEGATIVE
Comment: NORMAL
Neisseria Gonorrhea: NEGATIVE
Trichomonas: NEGATIVE

## 2022-08-14 LAB — HIV ANTIBODY (ROUTINE TESTING W REFLEX): HIV Screen 4th Generation wRfx: NONREACTIVE

## 2022-08-14 LAB — RPR: RPR Ser Ql: NONREACTIVE

## 2022-08-19 ENCOUNTER — Ambulatory Visit: Payer: Medicaid Other | Admitting: Nurse Practitioner

## 2022-08-20 ENCOUNTER — Ambulatory Visit: Payer: Medicaid Other | Admitting: Nurse Practitioner

## 2022-09-16 ENCOUNTER — Ambulatory Visit: Payer: Medicaid Other | Admitting: Nurse Practitioner

## 2022-09-27 DIAGNOSIS — Z113 Encounter for screening for infections with a predominantly sexual mode of transmission: Secondary | ICD-10-CM | POA: Diagnosis not present

## 2022-09-27 DIAGNOSIS — Z7251 High risk heterosexual behavior: Secondary | ICD-10-CM | POA: Diagnosis not present

## 2022-10-21 ENCOUNTER — Ambulatory Visit: Payer: Medicaid Other | Admitting: Nurse Practitioner

## 2022-10-24 ENCOUNTER — Encounter: Payer: Self-pay | Admitting: Neurology

## 2022-10-28 ENCOUNTER — Ambulatory Visit
Admission: RE | Admit: 2022-10-28 | Discharge: 2022-10-28 | Disposition: A | Discharge status: Home / Self Care | Payer: Medicaid Other | Source: Ambulatory Visit | Attending: Neurology | Admitting: Neurology

## 2022-10-28 DIAGNOSIS — I671 Cerebral aneurysm, nonruptured: Secondary | ICD-10-CM | POA: Diagnosis not present

## 2022-10-28 MED ORDER — IOPAMIDOL (ISOVUE-370) INJECTION 76%
75.0000 mL | Freq: Once | INTRAVENOUS | Status: AC | PRN
  Administered 2022-10-28: 75 mL via INTRAVENOUS

## 2022-11-26 ENCOUNTER — Other Ambulatory Visit: Payer: Self-pay | Admitting: Family Medicine

## 2023-02-04 NOTE — Progress Notes (Unsigned)
NEUROLOGY FOLLOW UP OFFICE NOTE  [name] [id number]  Assessment/Plan:   Episodic cluster headache, stable    If cluster recurs, restart verapamil 80mg  3 times daily Sumatriptan 20mg  NS if needed. Follow up 6 months.   Subjective:  Ross Ryan is a 42 year old right-handed male with HTN and depression who follows up for cluster headache.    UPDATE: Repeat CTA of head on 10/28/2022 personally reviewed indicated that the previously noted outpouching from the distal right ICA appeared normal with no evidence of aneurysm, and perhaps the finding was due to somewhat tortuosity of the PCOMM origin.    *** Current NSAIDS/analgesics:  ibuprofen 600mg  Current triptans:  sumatriptan 20mg  NS Current ergotamine:  none Current anti-emetic:  none Current muscle relaxants:  none Current Antihypertensive medications:  verapamil 80mg  TID Current Antidepressant medications:  none Current Anticonvulsant medications:  none Current anti-CGRP:  none Current Vitamins/Herbal/Supplements:  none Current Antihistamines/Decongestants:  none Other therapy:  none Hormone/birth control:  none     HISTORY:  Started in June 2022.  Severe shooting pain that starts at the right TMJ and travels up behind the right eye, temple and behind the ear.  Symptoms similar to when he previously had mastoiditis.  Took antibiotics which was ineffective..  Saw the dentist and underwent root canal which was ineffective.  He has associated blurred vision in right eye, right eye lacrimation, right-sided rhinorrhea, and photophobia.  No nausea, vomiting, ptosis, conjunctival injection, speech disturbance, numbness or weakness.  It lasts 1 hour with sumatriptan 100mg  tablet, otherwise up to 3 hours.  It occurs 1 to 2 times a day.  When he has it, he cannot keep still (paces, rocks back and forth).  Alcohol and marijuana smoke are triggers.  Nothing really relieves it.  He went to the ED on 11/15/2020 and 11/22/2020 for  this.  CT head on 11/22/2020 personally reviewed showed bilateral opacification of the bilateral mastoids but no acute findings. MRI brain with and without contrast on 04/10/2022 showed chronic opacification of mastoid air cells and petrous apex bilaterally but normal brain.  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  Follow up CTA of head on 04/29/2022 was not able to clarify finding, whether infundibulum vs small aneurysm.    Past NSAIDS/analgesics:  meloxicam, naproxen Past abortive triptans:  sumatriptan 100mg  Past abortive ergotamine:  none Past muscle relaxants:  Flexeril, Robaxin Past anti-emetic:  Reglan Past antihypertensive medications:  verapamil (helped), amlodipine Past antidepressant medications:  fluoxetine, mirtazpine Past anticonvulsant medications:  none Past anti-CGRP:  none Past vitamins/Herbal/Supplements:  none Past antihistamines/decongestants:  Flonase Other past therapies:  none  PAST MEDICAL HISTORY: Past Medical History:  Diagnosis Date   Hypertension 12/2016   Migraines     MEDICATIONS: Current Outpatient Medications on File Prior to Visit  Medication Sig Dispense Refill   clobetasol ointment (TEMOVATE) 0.05 % Use once daily only on affected areas on right lower leg. Do not use on normal skin. 60 g 1   omeprazole (PRILOSEC) 40 MG capsule TAKE ONE CAPSULE BY MOUTH EVERY MORNING BEFORE BREAKFAST 90 capsule 3   SUMAtriptan (IMITREX) 20 MG/ACT nasal spray Place 1 spray (20 mg total) into the nose as needed for migraine or headache. May repeat in 1 hour.  Maximum 2 doses in 24 hours. 6 each 5   verapamil (CALAN) 80 MG tablet Take 1 tablet (80 mg total) by mouth 3 (three) times daily.  90 tablet 5   No current facility-administered medications on file prior to visit.    ALLERGIES: No Known Allergies  FAMILY HISTORY: Family History  Problem Relation Age of Onset   Hypertension Mother     Heart disease Mother        in her 37s   Diabetes Father    Hyperlipidemia Father    Hypertension Father    Healthy Sister    Healthy Brother    Dementia Maternal Grandmother    Other Maternal Grandfather        doesnt know cause of death   Prostate cancer Maternal Grandfather        33s   Heart disease Paternal Grandmother    Other Paternal Grandfather        doesnt know cause of death   Prostate cancer Maternal Uncle        in 31s   Prostate cancer Cousin        in 49s      Objective:  *** General: No acute distress.  Patient appears well-groomed.   Head:  Normocephalic/atraumatic Neck:  Supple.  No paraspinal tenderness.  Full range of motion. Heart:  Regular rate and rhythm. Neuro:  Alert and oriented.  Speech fluent and not dysarthric.  Language intact.  CN II-XII intact.  Bulk and tone normal.  Muscle strength 5/5 throughout.  Deep tendon reflexes 2+ throughout.  Gait normal.  Romberg negative.    Shon Millet, DO  CC: Tana Conch, MD

## 2023-02-05 ENCOUNTER — Ambulatory Visit: Payer: Medicaid Other | Admitting: Neurology

## 2023-02-05 ENCOUNTER — Encounter: Payer: Self-pay | Admitting: Neurology

## 2023-02-05 VITALS — BP 158/97 | HR 63 | Ht 76.0 in | Wt 248.8 lb

## 2023-02-05 DIAGNOSIS — G44019 Episodic cluster headache, not intractable: Secondary | ICD-10-CM | POA: Diagnosis not present

## 2023-02-05 NOTE — Patient Instructions (Signed)
If you should start having a recurrence of headaches, let me know and we can start the steroids and verapamil again  Follow up 6 months   Cluster Headache  A cluster headache is a type of primary headache that causes deep, intense head pain. Cluster headaches can last from 15 minutes to 3 hours. They usually occur on one side of the head or face. They may occur on the other side of the head when a new cluster of headaches starts. Often times, cluster headaches: Happen repeatedly over weeks to months. Happen many times a day. Occur at the same time of day, often at night. Happen in the fall and springtime. What are the causes? The cause of this condition is not known. Unlike migraine and tension headaches, a cluster headache is not usually related to triggers, such as foods, hormonal changes, or stress. What increases the risk? The following factors may make you likely to develop this condition: Being a male between the ages of 71-49 years old. Smoking or using products that contain nicotine or tobacco. Having high histamine levels. This can happen in people who have allergies. Taking medicines that cause blood vessels to get bigger, such as nitroglycerin. Having a parent or sibling who has cluster headaches. What are the signs or symptoms? Symptoms of this condition include: Very bad pain on one side of the head that begins behind or around your eye or temple. The pain may move to other areas of the face, head, and neck. Nausea. Sensitivity to light. Runny nose and nasal stuffiness. Sweating on the affected side of the face or forehead. Droopy or swollen eyelid, eye redness, or tearing on the affected side. Feeling restless or upset. Pale skin or reddened face (flushed). How is this diagnosed? Cluster headaches may be diagnosed based on: Your symptoms and the description of the attacks, including your pain level, where you feel the headaches, and when they start. How often your  headaches happen and how long they last. A neurological exam to find physical signs of a neurological disorder. Your senses, reflexes, and nerves will be tested. The exam is usually normal in people who have cluster headaches. Tests to rule out other medical conditions. Tests may include: MRI. CT scan. Blood tests. How is this treated? Cluster headaches may be treated with: Medicines to relieve pain and to prevent repeated attacks. Some people may need more than one medicine. Oxygen that is breathed in through a mask. This helps to relieve pain of an attack in 15-20 minutes. An injection near the base of the skull (occipital nerve block) with a steroid and numbing medicine. Surgery in very bad cases. Follow these instructions at home: Headache diary Keep a headache diary. Doing this can help you and your health care provider find out what triggers your headaches. In the diary, include: What time your headache started and what you were doing when it began. How long your headache lasted. Where your pain started and if it moved to other areas. The type of pain, such as burning, stabbing, throbbing, or cramping. Your pain level. Use a pain scale and rate the pain with a number from 1 (mild) up to 10 (severe). The treatment that you used, and any change in symptoms after treatment. Medicines Take over-the-counter and prescription medicines only as told by your provider. Ask your provider if the medicine prescribed to you: Requires you to avoid driving or using machinery. Can cause constipation. You may need to take these actions to prevent or  treat constipation: Drink enough fluid to keep your pee (urine) pale yellow. Take over-the-counter or prescription medicines. Eat foods that are high in fiber, such as beans, whole grains, and fresh fruits and vegetables. Limit foods that are high in fat and processed sugars, such as fried or sweet foods. Lifestyle Get 7-9 hours of sleep each night, or  the amount recommended by your provider. Limit or manage stress. Talk to your provider about stress relief options, such as acupuncture, counseling, biofeedback, and massage. Exercise regularly. Exercise for at least 30 minutes, 5 times each week. Moderate exercise may be best. Eat a healthy diet and avoid any foods that may trigger your headaches. Do not drink alcohol. Alcohol may quickly trigger a severe headache. Do not use any products that contain nicotine or tobacco. These products include cigarettes, chewing tobacco, and vaping devices, such as e-cigarettes. If you need help quitting, ask your provider. General instructions Use oxygen as told by your provider. Contact a health care provider if: Your headaches change, get worse, or happen more often. The medicine or oxygen that your provider recommends does not help. Get help right away if: You faint. You have weakness or numbness, especially on one side of your body or face. You have double vision. You have nausea or vomiting that does not go away after many hours. You have trouble talking, walking, or keeping your balance. You have pain or stiffness in your neck and you have a fever. This information is not intended to replace advice given to you by your health care provider. Make sure you discuss any questions you have with your health care provider. Document Revised: 01/06/2022 Document Reviewed: 01/06/2022 Elsevier Patient Education  2024 [employer].

## 2023-02-27 ENCOUNTER — Ambulatory Visit
Admission: EM | Admit: 2023-02-27 | Discharge: 2023-02-27 | Disposition: A | Discharge status: Home / Self Care | Payer: Medicaid Other | Attending: Internal Medicine | Admitting: Internal Medicine

## 2023-02-27 DIAGNOSIS — H6991 Unspecified Eustachian tube disorder, right ear: Secondary | ICD-10-CM

## 2023-02-27 DIAGNOSIS — H66001 Acute suppurative otitis media without spontaneous rupture of ear drum, right ear: Secondary | ICD-10-CM

## 2023-02-27 MED ORDER — PREDNISONE 20 MG PO TABS: 40.0000 mg | ORAL_TABLET | Freq: Every day | ORAL | 0 refills | Status: AC

## 2023-02-27 MED ORDER — AMOXICILLIN 875 MG PO TABS: 875.0000 mg | ORAL_TABLET | Freq: Two times a day (BID) | ORAL | 0 refills | Status: AC

## 2023-02-27 MED ORDER — FLUTICASONE PROPIONATE 50 MCG/ACT NA SUSP: 1.0000 | Freq: Every day | NASAL | 0 refills | Status: AC

## 2023-02-27 NOTE — ED Provider Notes (Signed)
[facility]    CSN: [id number] Arrival date & time: 02/27/23  1706      History   Chief Complaint Chief Complaint  Patient presents with   Ear Fullness    HPI Ross Ryan is a 42 y.o. male presents for ear pain/muffled hearing.  Patient reports 3 days of right ear pressure/muffled hearing.  States he did have upper respiratory infection from last weekend that has resolved with the exception of some mild congestion.  Reports he had a history of issues with his left ear and did have tubes placed 1 to 2 years ago but is not sure if they are still in place.  No fevers.  He has not taken any OTC medications for symptoms since onset.  No other concerns at this time.   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 Authorizing Provider  amoxicillin (AMOXIL) 875 MG tablet Take 1 tablet (875 mg total) by mouth 2 (two) times daily for 10 days. 02/27/23 03/09/23 Yes Radford Pax, NP  fluticasone (FLONASE) 50 MCG/ACT nasal spray Place 1 spray into both nostrils daily.  02/27/23  Yes Radford Pax, NP  omeprazole (PRILOSEC) 40 MG capsule TAKE ONE CAPSULE BY MOUTH EVERY MORNING BEFORE BREAKFAST 11/26/22  Yes Shelva Majestic, MD  predniSONE (DELTASONE) 20 MG tablet Take 2 tablets (40 mg total) by mouth daily with breakfast for 5 days. 02/28/23 03/05/23 Yes Radford Pax, NP  clobetasol ointment (TEMOVATE) 0.05 % Use once daily only on affected areas on right lower leg. Do not use on normal skin. Patient not taking: Reported on 02/05/2023 11/01/21   Shelva Majestic, MD  SUMAtriptan [facility]) 20 MG/ACT nasal spray Place 1 spray (20 mg total) into the nose as needed for migraine or headache. May repeat in 1 hour.  Maximum 2 doses in 24 hours. Patient not taking: Reported on 02/05/2023 04/11/22   Drema Dallas, DO  verapamil (CALAN) 80 MG tablet Take 1 tablet (80 mg total) by mouth 3 (three) times daily. Patient not taking: Reported on 02/05/2023 04/11/22   Drema Dallas, DO    Family History Family History  Problem Relation Age of Onset   Hypertension Mother    Heart disease Mother        in her 53s   Diabetes Father    Hyperlipidemia Father    Hypertension Father    Healthy Sister    Healthy Brother    Dementia Maternal Grandmother    Other Maternal Grandfather  doesnt know cause of death   Prostate cancer Maternal Grandfather        60s   Heart disease Paternal Grandmother    Other Paternal Grandfather        doesnt know cause of death   Prostate cancer Maternal Uncle        in 63s   Prostate cancer Cousin        in 53s    Social History Social History   Tobacco Use   Smoking status: Some Days    Types: Cigarettes, Cigars   Smokeless tobacco: Never   Tobacco comments:    smokes cigars socially- encouraged cessation  Vaping Use   Vaping status: Never Used  Substance Use Topics   Alcohol use: Yes    Comment: social   Drug use: Not Currently    Types: Marijuana     Allergies   Patient has no known allergies.   Review of  Systems Review of Systems  HENT:         Right ear pressure/muffled hearing     Physical Exam Triage Vital Signs ED Triage Vitals  Encounter Vitals Group     BP 02/27/23 1724 (!) 157/97     Systolic BP Percentile --      Diastolic BP Percentile --      Pulse Rate 02/27/23 1724 75     Resp 02/27/23 1724 16     Temp 02/27/23 1724 98.6 F (37 C)     Temp Source 02/27/23 1724 Oral     SpO2 02/27/23 1724 98 %     Weight --      Height --      Head Circumference --      Peak Flow --      Pain Score 02/27/23 1723 0     Pain Loc --      Pain Education --      Exclude from Growth Chart --    No data found.  Updated Vital Signs BP (!) 157/97 (BP Location: Right Arm)   Pulse 75   Temp 98.6 F (37 C) (Oral)   Resp 16   SpO2 98%   Visual Acuity Right Eye Distance:   Left Eye Distance:   Bilateral Distance:    Right Eye Near:   Left Eye Near:    Bilateral Near:     Physical Exam Vitals and nursing note reviewed.  Constitutional:      General: He is not in acute distress.    Appearance: Normal appearance. He is not ill-appearing, toxic-appearing or diaphoretic.  HENT:     Head: Normocephalic and atraumatic.     Right Ear: Ear canal normal. A middle ear effusion is present. Tympanic membrane is erythematous.     Left Ear: There is impacted cerumen.  Eyes:     Pupils: Pupils are equal, round, and reactive to light.  Cardiovascular:     Rate and Rhythm: Normal rate.  Pulmonary:     Effort: Pulmonary effort is normal.  Skin:    General: Skin is warm and dry.  Neurological:     General: No focal deficit present.     Mental Status: He is alert and oriented to person, place, and time.  Psychiatric:        Mood and Affect: Mood normal.        Behavior: Behavior normal.      UC Treatments / Results  Labs (all labs ordered are listed, but only abnormal results are  displayed) Labs Reviewed - No data to display  EKG   Radiology No results  found.  Procedures Procedures (including critical care time)  Medications Ordered in UC Medications - No data to display  Initial Impression / Assessment and Plan / UC Course  I have reviewed the triage vital signs and the nursing notes.  Pertinent labs & imaging results that were available during my care of the patient were reviewed by me and considered in my medical decision making (see chart for details).     Reviewed exam and symptoms with patient.  Will start amoxicillin for right OM.  Flonase and prednisone also for eustachian tube dysfunction.  Unable to visualize left TM.  Discussed with patient flushing the ear with tubes in place is not recommended.  Will refer to ENT for wax removal and further evaluation of his symptoms if they do not improve.  Advised PCP follow-up in 2 to 3 days for recheck.  ER precautions reviewed. Final Clinical Impressions(s) / UC Diagnoses   Final diagnoses:  Eustachian tube dysfunction, right  Non-recurrent acute suppurative otitis media of right ear without spontaneous rupture of tympanic membrane     Discharge Instructions      Start amoxicillin twice daily for 10 days.  Start Flonase nasal spray daily.  Start prednisone once a day for 5 days tomorrow, 10/5.  Please follow-up with ear nose and throat if your symptoms do not improve.  Please go to the ER if you develop any worsening symptoms.  I hope you feel better soon!    ED Prescriptions     Medication Sig Dispense Auth. Provider   fluticasone (FLONASE) 50 MCG/ACT nasal spray Place 1 spray into both nostrils daily. 15.8 mL Radford Pax, NP   amoxicillin (AMOXIL) 875 MG tablet Take 1 tablet (875 mg total) by mouth 2 (two) times daily for 10 days. 20 tablet Radford Pax, NP   predniSONE (DELTASONE) 20 MG tablet Take 2 tablets (40 mg total) by mouth daily with breakfast for 5 days. 10 tablet Radford Pax, NP      PDMP not reviewed this encounter.   Radford Pax, NP 02/27/23  [phone]

## 2023-02-27 NOTE — Discharge Instructions (Addendum)
Start amoxicillin twice daily for 10 days.  Start Flonase nasal spray daily.  Start prednisone once a day for 5 days tomorrow, 10/5.  Please follow-up with ear nose and throat if your symptoms do not improve.  Please go to the ER if you develop any worsening symptoms.  I hope you feel better soon!

## 2023-02-27 NOTE — ED Triage Notes (Addendum)
Pt presents to UC w/ c/o muffled hearing and pressure x3 days. Pt states he was recently "sick" with nasal congestion this weekend. Pt reports he had a tube placed in his left ear 1-2 years ago.

## 2023-03-27 ENCOUNTER — Ambulatory Visit: Payer: Medicaid Other | Admitting: Family Medicine

## 2023-04-07 ENCOUNTER — Ambulatory Visit: Payer: Medicaid Other | Admitting: Family Medicine

## 2023-04-17 ENCOUNTER — Ambulatory Visit (INDEPENDENT_AMBULATORY_CARE_PROVIDER_SITE_OTHER): Payer: Medicaid Other | Admitting: Family Medicine

## 2023-04-17 ENCOUNTER — Encounter: Payer: Self-pay | Admitting: Family Medicine

## 2023-04-17 VITALS — BP 132/84 | HR 80 | Temp 98.4°F | Ht 76.0 in | Wt 246.0 lb

## 2023-04-17 DIAGNOSIS — R1032 Left lower quadrant pain: Secondary | ICD-10-CM

## 2023-04-17 DIAGNOSIS — K4090 Unilateral inguinal hernia, without obstruction or gangrene, not specified as recurrent: Secondary | ICD-10-CM

## 2023-04-17 DIAGNOSIS — F411 Generalized anxiety disorder: Secondary | ICD-10-CM | POA: Diagnosis not present

## 2023-04-17 NOTE — Patient Instructions (Addendum)
Please call [phone] to schedule a visit with [facility] - please tell the office you were directly referred by Dr. Sheppard Plumber sometimes can be backed up for up to 2 months so here are some other options to check in on: - [email or web address]  at  [phone]- [facility] if they accept your insurance -[facility] [email or web address] at [phone]  I suggest myfitnesspal Use 0.5 pounds per week weight loss goal (or up to 1 pounds if needed) Set a reasonable goal such as 5-10 lbs and can reset goal once you reach the goal Do not connect your step counter to this- watch or phone Update me in 2-3 months with how you are doing -lets work on weight loss . Exercise also good for blood pressure  Please stop by lab before you go If you have mychart- we will send your results within 3 business days of [place] receiving them.  If you do not have mychart- we will call you about results within 5 business days of [place] receiving them.  *please also note that you will see labs on mychart as soon as they post. I will later go in and write notes on them- will say "notes from Dr. Durene Cal"   We will call you within two weeks about your referral to CT abdomen pelvis through [facility].  Their phone number is [phone].  Please call them if you have not heard in 1-2 weeks - we are going to try to figure out what is causing your 2 types of pain- if you have new or worsening symptoms see [place] back  Recommended follow up: Return for next already scheduled visit or sooner if needed.

## 2023-04-17 NOTE — Progress Notes (Signed)
Phone [phone] In person visit   Subjective:   Ross Ryan is a 42 y.o. year old very pleasant male patient who presents for/with See problem oriented charting Chief Complaint  Patient presents with   Pain    Discomfort below waist line Lasting for a few months    Past Medical History-  Patient Active Problem List   Diagnosis Date Noted   Cluster headache 05/03/2021    Priority: Medium    GAD (generalized anxiety disorder) 05/03/2021    Priority: Medium    Gastroesophageal reflux disease without esophagitis 11/02/2018    Priority: Medium    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/05/2018    Priority: Medium    Hypertension 07/05/2018    Priority: Medium    Throat irritation 07/05/2018    Priority: Low   Weight loss 07/05/2018    Priority: Low   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[facility]  04/02/2017    Priority: Low   Vitamin B12 deficiency - oral repletion  02/02/2017    Priority: Low   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 15.8 mL 0   omeprazole (PRILOSEC) 40 MG capsule TAKE ONE CAPSULE BY MOUTH EVERY MORNING BEFORE BREAKFAST 90 capsule 3   clobetasol ointment (TEMOVATE) 0.05 % Use once daily only on affected areas on right lower leg. Do not use on normal skin. (Patient not taking: Reported on 02/05/2023) 60 g 1   SUMAtriptan (IMITREX) 20 MG/ACT nasal spray Place 1 spray (20 mg total) into the nose as needed for migraine or headache. May repeat in 1 hour.  Maximum 2 doses in 24 hours. (Patient not taking: Reported on 02/05/2023) 6 each 5   verapamil (CALAN) 80 MG tablet Take 1 tablet (80 mg total) by mouth 3 (three) times daily. (Patient not taking: Reported on 02/05/2023) 90 tablet 5   No current  facility-administered medications for this visit.     Objective:  BP 132/84   Pulse 80   Temp 98.4 F (36.9 C)   Ht 6\' 4"  (1.93 m)   Wt 246 lb (111.6 kg)   SpO2 99%   BMI 29.94 kg/m  Gen: NAD, resting comfortably CV: RRR no murmurs rubs or gallops Lungs: CTAB no crackles, wheeze, rhonchi Abdomen: soft/nontender-other than left lower quadrant and into the groin as below/nondistended/normal bowel sounds. No rebound or guarding.  Ext: no edema Skin: warm, dry, no rash of gluteal cleft or over area of reported pain in the groin Rectal: I see no abnormality at gluteal cleft where patient points to area prior discomfort-normal external rectum  Groin exam: In left lower abdomen and left groin patient with pain to palpation-perhaps mild bulge compared to the right side with Valsalva    Assessment and Plan   # Groin pain on the left # Gluteal cleft plan S:reports 2 types of discomfort -1 tenderness right below the belt just on left side- better with sitting, worse with standing for prolonged standing like a pushing sensation. Uncomfortable but no significant pain - maybe 3/10. Can be through day. Happens everyday. 6 months. No worsening -has discomfort above the rectum when he clenches or bears down- can feel it with moving positions even. Has not felt in a few days. Started 3 months ago.  Up to 6-7/10 makes him wence at times. No blood in stool. One black stool earlier this week but otherwise none. No nausea or vomting. No chest pain or shortness of breath   No dysuria or frequency. Reports dark urine even with good hydration A/P: I am concerned about a possible hernia in the left groin-he is rather tender to exam and I think even if this is not a hernia that pursuing CT imaging is reasonable.  His gluteal cleft pain is not actively present in this area appears normal but CT abdomen pelvis we will also image this area  #hypertension S: medication: none Home readings #s: Reports can be  into the 140s - not exercising at the moment , up 31 lbs June 2023- working on getting this down but needs to restart exercise BP Readings from Last 3 Encounters:  04/17/23 132/84  02/27/23 (!) 157/97  02/05/23 (!) 158/97  A/P: Blood pressure reasonable in office but we discussed wanting to further improve this.  Was focus on healthy eating/regular exercise/weight loss instead of starting medicine at this time  # generalized anxiety disorder S: easily irritable- hard with dating someone younger than him- has been accused of being nitpicky - a lot of stress with his work/company - this carries over to home -considering getting plugged back into therapy -not bad enough to start medicine again    04/17/2023    3:28 PM 04/17/2023    3:27 PM 05/02/2022    1:20 PM  Depression screen PHQ 2/9  Decreased Interest 0 0 0  Down, Depressed, Hopeless 0 0 0  PHQ - 2 Score 0 0 0  Altered sleeping 0  0  Tired, decreased energy 1  0  Change in appetite 0  0  Feeling bad or failure about yourself  0  0  Trouble concentrating 0  0  Moving slowly or fidgety/restless 0  0  Suicidal thoughts 0  0  PHQ-9 Score 1  0  Difficult doing work/chores Not difficult at all  Not difficult at all       04/17/2023    3:28 PM 10/29/2020    9:25 AM  GAD 7 : Generalized Anxiety Score  Nervous, Anxious, on Edge 2 3  Control/stop worrying 1 3  Worry too much - different things 1 3  Trouble relaxing 1 0  Restless 0 0  Easily annoyed or irritable 3 1  Afraid - awful might happen 0 3  Total GAD 7 Score 8 13  Anxiety Difficulty  Somewhat difficult  A/P: ongoing issues with this- would like to return to therapist- refer today-they will need to accept Medicaid-he does not want to pursue medication at this time  Recommended follow up: Return for next already scheduled visit or sooner if needed. Future Appointments  Date Time Provider Department Center  06/26/2023  1:00 PM Shelva Majestic, MD [facility]   08/10/2023  9:50 AM Drema Dallas, DO [facility] None    Lab/Order associations:   ICD-10-CM   1. GAD (generalized anxiety disorder)  F41.1 Ambulatory referral to Psychology    2. Left groin hernia  K40.90     3. Left groin pain  R10.32 CT ABDOMEN PELVIS W CONTRAST    Comprehensive metabolic panel    CBC with Differential/Platelet    Urinalysis, Routine w reflex microscopic    Comprehensive metabolic panel    CBC with Differential/Platelet    Urinalysis, Routine w reflex microscopic     No orders of the  defined types were placed in this encounter.   Return precautions advised.  Tana Conch, MD

## 2023-04-18 LAB — URINALYSIS, ROUTINE W REFLEX MICROSCOPIC
Bilirubin Urine: NEGATIVE
Glucose, UA: NEGATIVE
Hgb urine dipstick: NEGATIVE
Ketones, ur: NEGATIVE
Leukocytes,Ua: NEGATIVE
Nitrite: NEGATIVE
Protein, ur: NEGATIVE
Specific Gravity, Urine: 1.029 (ref 1.001–1.035)
pH: 5.5 (ref 5.0–8.0)

## 2023-04-18 LAB — CBC WITH DIFFERENTIAL/PLATELET
Absolute Lymphocytes: 2495 {cells}/uL (ref 850–3900)
Absolute Monocytes: 583 {cells}/uL (ref 200–950)
Basophils Absolute: 22 {cells}/uL (ref 0–200)
Basophils Relative: 0.4 %
Eosinophils Absolute: 59 {cells}/uL (ref 15–500)
Eosinophils Relative: 1.1 %
HCT: 41.8 % (ref 38.5–50.0)
Hemoglobin: 13.6 g/dL (ref 13.2–17.1)
MCH: 25.6 pg — ABNORMAL LOW (ref 27.0–33.0)
MCHC: 32.5 g/dL (ref 32.0–36.0)
MCV: 78.7 fL — ABNORMAL LOW (ref 80.0–100.0)
MPV: 9.6 fL (ref 7.5–12.5)
Monocytes Relative: 10.8 %
Neutro Abs: 2241 {cells}/uL (ref 1500–7800)
Neutrophils Relative %: 41.5 %
Platelets: 242 10*3/uL (ref 140–400)
RBC: 5.31 10*6/uL (ref 4.20–5.80)
RDW: 13.3 % (ref 11.0–15.0)
Total Lymphocyte: 46.2 %
WBC: 5.4 10*3/uL (ref 3.8–10.8)

## 2023-04-18 LAB — COMPREHENSIVE METABOLIC PANEL
AG Ratio: 1.4 (calc) (ref 1.0–2.5)
ALT: 28 U/L (ref 9–46)
AST: 24 U/L (ref 10–40)
Albumin: 4.5 g/dL (ref 3.6–5.1)
Alkaline phosphatase (APISO): 52 U/L (ref 36–130)
BUN: 10 mg/dL (ref 7–25)
CO2: 26 mmol/L (ref 20–32)
Calcium: 9.8 mg/dL (ref 8.6–10.3)
Chloride: 105 mmol/L (ref 98–110)
Creat: 1.24 mg/dL (ref 0.60–1.29)
Globulin: 3.2 g/dL (ref 1.9–3.7)
Glucose, Bld: 92 mg/dL (ref 65–99)
Potassium: 4.2 mmol/L (ref 3.5–5.3)
Sodium: 139 mmol/L (ref 135–146)
Total Bilirubin: 0.8 mg/dL (ref 0.2–1.2)
Total Protein: 7.7 g/dL (ref 6.1–8.1)

## 2023-05-04 ENCOUNTER — Other Ambulatory Visit: Payer: Medicaid Other

## 2023-05-05 ENCOUNTER — Ambulatory Visit: Payer: Medicaid Other | Admitting: Family Medicine

## 2023-05-15 ENCOUNTER — Ambulatory Visit: Payer: Medicaid Other | Admitting: Family Medicine

## 2023-05-25 ENCOUNTER — Other Ambulatory Visit: Payer: Medicaid Other

## 2023-06-26 ENCOUNTER — Other Ambulatory Visit: Payer: Self-pay

## 2023-06-26 ENCOUNTER — Encounter: Payer: Self-pay | Admitting: Family Medicine

## 2023-06-26 ENCOUNTER — Other Ambulatory Visit (HOSPITAL_COMMUNITY)
Admission: RE | Admit: 2023-06-26 | Discharge: 2023-06-26 | Disposition: A | Discharge status: Home / Self Care | Payer: Medicaid Other | Source: Ambulatory Visit | Attending: Family Medicine | Admitting: Family Medicine

## 2023-06-26 ENCOUNTER — Ambulatory Visit (INDEPENDENT_AMBULATORY_CARE_PROVIDER_SITE_OTHER): Payer: Medicaid Other | Admitting: Family Medicine

## 2023-06-26 VITALS — BP 130/80 | HR 70 | Temp 97.8°F | Ht 76.0 in | Wt 239.2 lb

## 2023-06-26 DIAGNOSIS — Z130 Encounter for screening for diseases of the blood and blood-forming organs and certain disorders involving the immune mechanism: Secondary | ICD-10-CM

## 2023-06-26 DIAGNOSIS — Z0000 Encounter for general adult medical examination without abnormal findings: Secondary | ICD-10-CM | POA: Diagnosis not present

## 2023-06-26 DIAGNOSIS — Z87891 Personal history of nicotine dependence: Secondary | ICD-10-CM | POA: Diagnosis not present

## 2023-06-26 DIAGNOSIS — Z125 Encounter for screening for malignant neoplasm of prostate: Secondary | ICD-10-CM | POA: Diagnosis not present

## 2023-06-26 DIAGNOSIS — Z118 Encounter for screening for other infectious and parasitic diseases: Secondary | ICD-10-CM

## 2023-06-26 DIAGNOSIS — E538 Deficiency of other specified B group vitamins: Secondary | ICD-10-CM

## 2023-06-26 DIAGNOSIS — Z13220 Encounter for screening for lipoid disorders: Secondary | ICD-10-CM | POA: Diagnosis not present

## 2023-06-26 DIAGNOSIS — Z113 Encounter for screening for infections with a predominantly sexual mode of transmission: Secondary | ICD-10-CM | POA: Diagnosis not present

## 2023-06-26 DIAGNOSIS — Z114 Encounter for screening for human immunodeficiency virus [HIV]: Secondary | ICD-10-CM | POA: Diagnosis not present

## 2023-06-26 DIAGNOSIS — H9012 Conductive hearing loss, unilateral, left ear, with unrestricted hearing on the contralateral side: Secondary | ICD-10-CM | POA: Diagnosis not present

## 2023-06-26 LAB — CBC WITH DIFFERENTIAL/PLATELET
Basophils Absolute: 0 10*3/uL (ref 0.0–0.1)
Basophils Relative: 0.5 % (ref 0.0–3.0)
Eosinophils Absolute: 0 10*3/uL (ref 0.0–0.7)
Eosinophils Relative: 0.7 % (ref 0.0–5.0)
HCT: 43.8 % (ref 39.0–52.0)
Hemoglobin: 14 g/dL (ref 13.0–17.0)
Lymphocytes Relative: 38.4 % (ref 12.0–46.0)
Lymphs Abs: 2 10*3/uL (ref 0.7–4.0)
MCHC: 32 g/dL (ref 30.0–36.0)
MCV: 81.9 fL (ref 78.0–100.0)
Monocytes Absolute: 0.6 10*3/uL (ref 0.1–1.0)
Monocytes Relative: 11.3 % (ref 3.0–12.0)
Neutro Abs: 2.5 10*3/uL (ref 1.4–7.7)
Neutrophils Relative %: 49.1 % (ref 43.0–77.0)
Platelets: 237 10*3/uL (ref 150.0–400.0)
RBC: 5.34 Mil/uL (ref 4.22–5.81)
RDW: 14.1 % (ref 11.5–15.5)
WBC: 5.1 10*3/uL (ref 4.0–10.5)

## 2023-06-26 LAB — COMPREHENSIVE METABOLIC PANEL
ALT: 24 U/L (ref 0–53)
AST: 21 U/L (ref 0–37)
Albumin: 4.7 g/dL (ref 3.5–5.2)
Alkaline Phosphatase: 59 U/L (ref 39–117)
BUN: 8 mg/dL (ref 6–23)
CO2: 30 meq/L (ref 19–32)
Calcium: 9.6 mg/dL (ref 8.4–10.5)
Chloride: 102 meq/L (ref 96–112)
Creatinine, Ser: 1.13 mg/dL (ref 0.40–1.50)
GFR: 81.27 mL/min (ref >60.00)
Glucose, Bld: 88 mg/dL (ref 70–99)
Potassium: 3.8 meq/L (ref 3.5–5.1)
Sodium: 140 meq/L (ref 135–145)
Total Bilirubin: 0.7 mg/dL (ref 0.2–1.2)
Total Protein: 7.6 g/dL (ref 6.0–8.3)

## 2023-06-26 LAB — URINALYSIS, ROUTINE W REFLEX MICROSCOPIC
Bilirubin Urine: NEGATIVE
Hgb urine dipstick: NEGATIVE
Ketones, ur: NEGATIVE
Leukocytes,Ua: NEGATIVE
Nitrite: NEGATIVE
RBC / HPF: NONE SEEN (ref 0–?)
Specific Gravity, Urine: <=1.005 — AB (ref 1.000–1.030)
Total Protein, Urine: NEGATIVE
Urine Glucose: NEGATIVE
Urobilinogen, UA: 0.2 (ref 0.0–1.0)
pH: 6 (ref 5.0–8.0)

## 2023-06-26 LAB — LIPID PANEL
Cholesterol: 108 mg/dL (ref 0–200)
HDL: 45.9 mg/dL (ref >39.00)
LDL Cholesterol: 50 mg/dL (ref 0–99)
NonHDL: 62.08
Total CHOL/HDL Ratio: 2
Triglycerides: 60 mg/dL (ref 0.0–149.0)
VLDL: 12 mg/dL (ref 0.0–40.0)

## 2023-06-26 LAB — VITAMIN B12: Vitamin B-12: 271 pg/mL (ref 211–911)

## 2023-06-26 LAB — PSA: PSA: 0.75 ng/mL (ref 0.10–4.00)

## 2023-06-26 NOTE — Patient Instructions (Addendum)
Referring To Provider Information [facility] [address] [place] [address] [phone]  Referral End Date: 04/16/2024  Call # above to get scheduled with therapy visit  Team he needs some water  Team please give him exercises for tennis elbow (and wear counter force brace as discussed) I want you to do the exercise 3x a week for a month then once a week for another month. Stop any exercise that causes more than 1-2/10 pain increase. If not doing better within 1-2 months let us refer you to sports medicine  Please stop by lab before you go If you have mychart- we will send your results within 3 business days of [place] receiving them.  If you do not have mychart- we will call you about results within 5 business days of [place] receiving them.  *please also note that you will see labs on mychart as soon as they post. I will later go in and write notes on them- will say "notes from Dr. Durene Cal"   Recommended follow up: Return in about 1 year (around 06/25/2024) for physical or sooner if needed.Schedule b4 you leave. As long as doing well with therapy- see [place] back otherwise

## 2023-06-26 NOTE — Progress Notes (Signed)
Phone: [phone]    Subjective:  Patient presents today for their annual physical. Chief complaint-noted.   See problem oriented charting- ROS- full  review of systems was completed and negative  Per full ROS sheet completed by patient other than work stress, relational stress- broke up with long term girlfriend, grandmother on hospice- has been a tough month, some right elbow pain  The following were reviewed and entered/updated in epic: Past Medical History:  Diagnosis Date   Hypertension 12/2016   Migraines    Patient Active Problem List   Diagnosis Date Noted   Cluster headache 05/03/2021    Priority: Medium    GAD (generalized anxiety disorder) 05/03/2021    Priority: Medium    Gastroesophageal reflux disease without esophagitis 11/02/2018    Priority: Medium    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/05/2018    Priority: Medium    Hypertension 07/05/2018    Priority: Medium    Throat irritation 07/05/2018    Priority: Low   Weight loss 07/05/2018    Priority: Low   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[facility]  04/02/2017    Priority: Low   Vitamin B12 deficiency - oral repletion  02/02/2017    Priority: Low   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    Priority: Low   Loud snoring 05/30/2022   Past Surgical History:  Procedure Laterality Date   Fatty tumor removed from right Posterior forearm Right 2010   KNEE SURGERY Right    arthroscopic- scar tissue removal on acl apparently   tube in left ear Left 2022    Family History  Problem Relation Age of Onset   Hypertension Mother    Heart disease Mother        in her 21s   Diabetes Father    Hyperlipidemia Father    Hypertension Father    Healthy Sister    Healthy Brother    Dementia Maternal Grandmother    Other Maternal Grandfather        doesnt know cause of death   Prostate cancer Maternal Grandfather        70s   Heart disease Paternal Grandmother    Other  Paternal Grandfather        doesnt know cause of death   Prostate cancer Maternal Uncle        in 75s   Prostate cancer Cousin        in 27s    Medications- reviewed and updated Current Outpatient Medications  Medication Sig Dispense Refill   fluticasone (FLONASE) 50 MCG/ACT nasal spray Place 1 spray into both nostrils daily. 15.8 mL 0   omeprazole (PRILOSEC) 40 MG capsule TAKE ONE CAPSULE BY MOUTH EVERY MORNING BEFORE BREAKFAST 90 capsule 3   clobetasol ointment (TEMOVATE) 0.05 % Use once daily only on affected areas on right lower leg. Do not use on normal skin. (Patient not taking: Reported on 06/26/2023) 60 g 1   SUMAtriptan (IMITREX) 20 MG/ACT nasal spray Place 1 spray (20 mg total) into the nose as needed for migraine or headache. May repeat in 1 hour.  Maximum 2 doses in 24 hours. (Patient not taking: Reported on 06/26/2023) 6 each 5   No current facility-administered medications for this visit.    Allergies-reviewed and updated No Known Allergies  Social History   Social History Narrative   Lives alone. 2 kids-  4 and 14 in 2023. 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 Oropharynx normal Neck: no thyromegaly CV: RRR no murmurs rubs or gallops Lungs: CTAB no crackles, wheeze, rhonchi Abdomen: soft/nontender/nondistended/normal bowel sounds. No rebound or guarding.  Ext: no edema Skin: warm, dry, some healed prior likely abscess vs folliculitis noted in groin- no lymphadenopathy Neuro: grossly normal, moves all extremities, PERRLA Musculoskeletal-  pain over lateral epicondyle on the right particularly with resisted pronation Declines genitourinary and rectal exam    Assessment and Plan:  43 y.o. male presenting for annual physical.  Health Maintenance counseling: 1. Anticipatory guidance: Patient counseled regarding regular dental exams -q6 months, eye exams -no recent issues but may schedule,  avoiding smoking and second hand smoke- see below , limiting alcohol to 2 beverages per day- 7 a week bourbon or less, no illicit drugs- has done some THC   2. Risk factor reduction:  Advised patient of need for regular exercise and diet rich and fruits and vegetables to reduce risk of heart attack and stroke.  Exercise- not in gym since November- worried about right elbow.  Diet/weight management-up 24 pounds from his last physical in 2023- but was higher and has been watching diet and down 7 lbs from last vsit here. .  Wt Readings from Last 3 Encounters:  06/26/23 239 lb 3.2 oz (108.5 kg)  04/17/23 246 lb (111.6 kg)  02/05/23 248 lb 12.8 oz (112.9 kg)  3. Immunizations/screenings/ancillary studies-declines COVID-19 vaccination and Prevnar 20 despite smoking, declines flu Immunization History  Administered Date(s) Administered   DTaP 02/07/1983, 05/06/1983, 08/22/1983, 04/07/1986, 09/28/1987   Hepatitis A 11/09/2017, 05/12/2018   Hepatitis A, Ped/Adol-2 Dose 11/09/2017, 05/12/2018   Hepatitis B 11/09/2017, 01/08/2018, 05/12/2018   IPV 02/07/1983, 05/06/1983, 08/22/1983, 09/28/1987   MMR 04/16/1984   PPD Test 02/29/2016   Tdap 07/05/2018  4. Prostate cancer screening- offered starting today due to cousin with prostate cancer in his 62s.  Nocturia twice a night in past- down to once a night Lab Results  Component Value Date   PSA 0.43 10/29/2020   5. Colon cancer screening - no family history, start at age 53  6. Skin cancer screening/prevention- lower risk due to melanin content. advised regular sunscreen use. Denies worrisome, changing,  or new skin lesions.  7. Testicular cancer screening- advised monthly self exams  8. STD screening- patient opts in- just broke up with long term girlfriend  9. Smoking associated screening- former smoker- mainly cigars- was 1 a day last physical but has cut out completely. 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 No dysuria or penile discharge. None in persistent areas previously  Had cervical lymphadenopathy in 2022 for 3 months with wanted to refer to ENT but he was out  of insurance and declined at that time (and when he returned for his next visit was no longer complaining about this).    # Left groin pain/gluteal cleft pain--see notes from last visit but we had ordered CT of the abdomen pelvis to rule out hernia and this would also assess the gluteal cleft but he did not complete this- states missed appointment - didn't tolerate contrast either- those issues are better  -he thinks equipment he was wearing was rubbing -feels better now  # B12 deficiency S: Current treatment/medication (oral vs. IM): B12 stopped  Lab Results  Component Value Date   VITAMINB12 392 10/29/2020  A/P: hopefully ok off of B12- check today   # GERD-on omeprazole 40 mg daily- no issues on this   # Migraines-sumatriptan available as needed-has worked with Dr. [name] in the past and has upcoming visit. No recent issues  # Depression and anxiety S: Medication:none  -Last visit he expressed easy irritability reported it was difficult dating from younger than himself - He did not feel like it was bad enough to restart medicine but wanted to pursue therapy when we referred him. States they connected once but never got fully  scheduled A/P: phq9 of 6 and General Anxiety Disorder - 7 question screening survey of 6- wants to connect again- gave # again to call today. No SI thankfully  #intermittent blood pressure elevations- blood pressure exercises help him  #Hearing loss - prior tympanostomy tube and saw Dr. Jenne Pane last 06/06/21- there was consideration for surgery vs hearing aid- hed like second opinion- refer to [facility]  Recommended follow up: Return in about 1 year (around 06/25/2024) for physical or sooner if needed.Schedule b4 you leave. Future Appointments  Date Time Provider Department Center  08/10/2023  9:50 AM Shon Millet R, DO [facility] None   Lab/Order associations: fasting   ICD-10-CM   1. Preventative health care  Z00.00     2. Screening for hyperlipidemia  Z13.220 Comprehensive metabolic panel    Lipid panel    3. Screening for deficiency anemia  Z13.0 CBC with Differential/Platelet    4. Vitamin B12 deficiency - oral repletion   E53.8 Vitamin B12    5. Screening for prostate cancer  Z12.5 PSA    6. Screening examination for venereal disease  Z11.3 RPR    7. Screening for HIV (human immunodeficiency virus)  Z11.4 HIV Antibody (routine testing w rflx)    8. Screening for gonorrhea  Z11.3 Urine cytology ancillary only    9. Screening for chlamydial disease  Z11.8 Urine cytology ancillary only    10. Former smoker  Z87.891 Urinalysis, Routine w reflex microscopic    11. Conductive hearing loss of left ear, unspecified hearing status on contralateral side  H90.12 Ambulatory referral to ENT      No orders of the defined types were placed in this encounter.   Return precautions advised.   Tana Conch, MD

## 2023-06-27 LAB — RPR: RPR Ser Ql: NONREACTIVE

## 2023-06-27 LAB — HIV ANTIBODY (ROUTINE TESTING W REFLEX): HIV 1&2 Ab, 4th Generation: NONREACTIVE

## 2023-06-28 ENCOUNTER — Encounter: Payer: Self-pay | Admitting: Family Medicine

## 2023-06-29 ENCOUNTER — Encounter: Payer: Self-pay | Admitting: Family Medicine

## 2023-06-29 LAB — URINE CYTOLOGY ANCILLARY ONLY
Chlamydia: NEGATIVE
Comment: NEGATIVE
Comment: NEGATIVE
Comment: NORMAL
Neisseria Gonorrhea: NEGATIVE
Trichomonas: NEGATIVE

## 2023-07-20 DIAGNOSIS — F39 Unspecified mood [affective] disorder: Secondary | ICD-10-CM | POA: Diagnosis not present

## 2023-07-20 DIAGNOSIS — Z79899 Other long term (current) drug therapy: Secondary | ICD-10-CM | POA: Diagnosis not present

## 2023-07-20 DIAGNOSIS — F411 Generalized anxiety disorder: Secondary | ICD-10-CM | POA: Diagnosis not present

## 2023-07-21 DIAGNOSIS — L0202 Furuncle of face: Secondary | ICD-10-CM | POA: Diagnosis not present

## 2023-07-21 DIAGNOSIS — D234 Other benign neoplasm of skin of scalp and neck: Secondary | ICD-10-CM | POA: Diagnosis not present

## 2023-07-29 DIAGNOSIS — Z600 Problems of adjustment to life-cycle transitions: Secondary | ICD-10-CM | POA: Diagnosis not present

## 2023-07-29 DIAGNOSIS — F411 Generalized anxiety disorder: Secondary | ICD-10-CM | POA: Diagnosis not present

## 2023-08-07 NOTE — Progress Notes (Signed)
 NEUROLOGY FOLLOW UP OFFICE NOTE  Ross Ryan [id number]  Assessment/Plan:   Episodic cluster headache, stable    If cluster recurs, prescribe prolonged prednisone taper and restart verapamil 80mg  3 times daily  Sumatriptan 20mg  NS if needed.  Follow up 6 months.   Subjective:  Ross Ryan is a 43 year old right-handed male with HTN and depression who follows up for cluster headache.    UPDATE:     No recurrent headaches since last visit.   Current NSAIDS/analgesics:  ibuprofen 600mg  Current triptans:  sumatriptan 20mg  NS Current ergotamine:  none Current anti-emetic:  none Current muscle relaxants:  none Current Antihypertensive medications:  none Current Antidepressant medications:  none Current Anticonvulsant medications:  none Current anti-CGRP:  none Current Vitamins/Herbal/Supplements:  none Current Antihistamines/Decongestants:  none Other therapy:  none Hormone/birth control:  none     HISTORY:  Started in June 2022.  Severe shooting pain that starts at the right TMJ and travels up behind the right eye, temple and behind the ear.  Symptoms similar to when he previously had mastoiditis.  Took antibiotics which was ineffective..  Saw the dentist and underwent root canal which was ineffective.  He has associated blurred vision in right eye, right eye lacrimation, right-sided rhinorrhea, and photophobia.  No nausea, vomiting, ptosis, conjunctival injection, speech disturbance, numbness or weakness.  It lasts 1 hour with sumatriptan 100mg  tablet, otherwise up to 3 hours.  It occurs 1 to 2 times a day.  When he has it, he cannot keep still (paces, rocks back and forth).  Alcohol and marijuana smoke are triggers.  Nothing really relieves it.  He went to the ED on 11/15/2020 and 11/22/2020 for this.  CT head on 11/22/2020 personally reviewed showed bilateral opacification of the bilateral mastoids but no acute findings. MRI brain with and without contrast on 04/10/2022  showed chronic opacification of mastoid air cells and petrous apex bilaterally but normal brain.  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  Follow up CTA of head on 04/29/2022 was not able to clarify finding, whether infundibulum vs small aneurysm. Repeat CTA of head on 10/28/2022 indicated that the previously noted outpouching from the distal right ICA appeared normal with no evidence of aneurysm, and perhaps the finding was due to somewhat tortuosity of the PCOMM origin.   Past NSAIDS/analgesics:  meloxicam, naproxen Past abortive triptans:  sumatriptan 100mg  Past abortive ergotamine:  none Past muscle relaxants:  Flexeril, Robaxin Past anti-emetic:  Reglan Past antihypertensive medications:  verapamil (helped -restart as needed), amlodipine Past antidepressant medications:  fluoxetine, mirtazpine Past anticonvulsant medications:  none Past anti-CGRP:  none Past vitamins/Herbal/Supplements:  none Past antihistamines/decongestants:  Flonase Other past therapies:  prednisone  PAST MEDICAL HISTORY: Past Medical History:  Diagnosis Date   Hypertension 12/2016   Migraines     MEDICATIONS: Current Outpatient Medications on File Prior to Visit  Medication Sig Dispense Refill   clobetasol ointment (TEMOVATE) 0.05 % Use once daily only on affected areas on right lower leg. Do not use on normal skin. (Patient not taking: Reported on 06/26/2023) 60 g 1   fluticasone (FLONASE) 50 MCG/ACT nasal spray Place 1 spray into both nostrils daily. 15.8 mL 0   omeprazole (PRILOSEC) 40 MG capsule TAKE ONE CAPSULE BY MOUTH EVERY MORNING BEFORE BREAKFAST 90 capsule 3   SUMAtriptan (IMITREX) 20 MG/ACT nasal spray Place 1 spray (20 mg total) into the nose as needed for  migraine or headache. May repeat in 1 hour.  Maximum 2 doses in 24 hours. (Patient not taking: Reported on 06/26/2023) 6 each 5   No current facility-administered medications on  file prior to visit.    ALLERGIES: No Known Allergies  FAMILY HISTORY: Family History  Problem Relation Age of Onset   Hypertension Mother    Heart disease Mother        in her 66s   Diabetes Father    Hyperlipidemia Father    Hypertension Father    Healthy Sister    Healthy Brother    Dementia Maternal Grandmother    Other Maternal Grandfather        doesnt know cause of death   Prostate cancer Maternal Grandfather        15s   Heart disease Paternal Grandmother    Other Paternal Grandfather        doesnt know cause of death   Prostate cancer Maternal Uncle        in 34s   Prostate cancer Cousin        in 27s      Objective:  Blood pressure 137/85, pulse (!) 53, height 6\' 4"  (1.93 m), weight 237 lb (107.5 kg), SpO2 100%. General: No acute distress.  Patient appears well-groomed.       Shon Millet, DO  CC: Tana Conch, MD

## 2023-08-10 ENCOUNTER — Encounter: Payer: Self-pay | Admitting: Neurology

## 2023-08-10 ENCOUNTER — Ambulatory Visit (INDEPENDENT_AMBULATORY_CARE_PROVIDER_SITE_OTHER): Payer: Medicaid Other | Admitting: Neurology

## 2023-08-10 VITALS — BP 137/85 | HR 53 | Ht 76.0 in | Wt 237.0 lb

## 2023-08-10 DIAGNOSIS — G44019 Episodic cluster headache, not intractable: Secondary | ICD-10-CM | POA: Diagnosis not present

## 2023-08-10 MED ORDER — SUMATRIPTAN 20 MG/ACT NA SOLN: 20.0000 mg | NASAL | 5 refills | Status: DC | PRN

## 2023-08-13 DIAGNOSIS — Z600 Problems of adjustment to life-cycle transitions: Secondary | ICD-10-CM | POA: Diagnosis not present

## 2023-08-13 DIAGNOSIS — F411 Generalized anxiety disorder: Secondary | ICD-10-CM | POA: Diagnosis not present

## 2023-08-20 ENCOUNTER — Ambulatory Visit (INDEPENDENT_AMBULATORY_CARE_PROVIDER_SITE_OTHER): Payer: Medicaid Other | Admitting: Audiology

## 2023-08-20 ENCOUNTER — Institutional Professional Consult (permissible substitution) (INDEPENDENT_AMBULATORY_CARE_PROVIDER_SITE_OTHER): Payer: Medicaid Other

## 2023-08-20 DIAGNOSIS — Z600 Problems of adjustment to life-cycle transitions: Secondary | ICD-10-CM | POA: Diagnosis not present

## 2023-08-20 DIAGNOSIS — F411 Generalized anxiety disorder: Secondary | ICD-10-CM | POA: Diagnosis not present

## 2023-08-27 DIAGNOSIS — Z600 Problems of adjustment to life-cycle transitions: Secondary | ICD-10-CM | POA: Diagnosis not present

## 2023-08-27 DIAGNOSIS — F411 Generalized anxiety disorder: Secondary | ICD-10-CM | POA: Diagnosis not present

## 2023-09-02 ENCOUNTER — Ambulatory Visit (INDEPENDENT_AMBULATORY_CARE_PROVIDER_SITE_OTHER): Admitting: Audiology

## 2023-09-02 ENCOUNTER — Ambulatory Visit (INDEPENDENT_AMBULATORY_CARE_PROVIDER_SITE_OTHER): Admitting: Otolaryngology

## 2023-09-02 ENCOUNTER — Encounter (INDEPENDENT_AMBULATORY_CARE_PROVIDER_SITE_OTHER): Payer: Self-pay

## 2023-09-02 VITALS — BP 136/83 | HR 61 | Ht 76.0 in | Wt 224.0 lb

## 2023-09-02 DIAGNOSIS — H6122 Impacted cerumen, left ear: Secondary | ICD-10-CM

## 2023-09-02 DIAGNOSIS — H6993 Unspecified Eustachian tube disorder, bilateral: Secondary | ICD-10-CM | POA: Diagnosis not present

## 2023-09-02 DIAGNOSIS — H9012 Conductive hearing loss, unilateral, left ear, with unrestricted hearing on the contralateral side: Secondary | ICD-10-CM | POA: Diagnosis not present

## 2023-09-02 DIAGNOSIS — H7292 Unspecified perforation of tympanic membrane, left ear: Secondary | ICD-10-CM | POA: Diagnosis not present

## 2023-09-02 NOTE — Progress Notes (Unsigned)
  [address] [place], [place] [address] [phone]  Audiological Evaluation    Name: Ross Ryan     DOB:   August 27, 1980      MRN:   [id number]                                                                                     Service Date: 09/02/2023     Accompanied by: unaccompanied    Patient comes today after Dr. Allena Katz, ENT sent a referral for a hearing evaluation due to concerns with hearing loss.   Symptoms Yes Details  Hearing loss  [x]  Notes form 2023 by Dr. Jenne Pane , ENT at [facility] ENT: Pure tone audiometry reveals normal hearing in the right ear and mild low and moderate high frequency conductive loss in the left ear. Speech discrimination is normal bilaterally. Tympanograms are difficult to interpret.   Tinnitus  []    Ear pain/ infections/pressure  []    Balance problems  []    Noise exposure history  []    Previous ear surgeries  []    Family history of hearing loss  []    Amplification  []    Other  [x]  Test was completed after left ear external ear impacted cerumen was removed by Dr. Allena Katz.    Otoscopy: Right ear: Clear external ear canals and notable landmarks visualized on the tympanic membrane. Left ear:  Clear external ear canals and notable landmarks visualized on the tympanic membrane.  Tympanometry: Right ear: {tympanometry results:31367} Left ear: {tympanometry results:31367}   Pure tone Audiometry: Right ear- Normal hearing from [id number] Hz.  *** Left ear-  *** {hearing loss types:31372::"sensorineural hearing loss"} from *** Hz - *** Hz.  Speech Audiometry: Right ear- Speech Reception Threshold (SRT) was obtained at *** dBHL. Left ear-Speech Reception Threshold (SRT) was obtained at *** dBHL.   Word Recognition Score Tested using NU-6 (MLV) Right ear: ***% was obtained at a presentation level of *** dBHL with contralateral masking which is deemed as  {word recognition score:31373}. Left ear: ***% was obtained at a presentation level  of *** dBHL with contralateral masking which is deemed as  {word recognition score:31373}.   The hearing test results were completed under inserts and re-checked with headphones  and results are deemed to be of good reliability. Test technique:  conventional      Recommendations: Follow up with ENT as scheduled for today., Return for a hearing evaluation if concerns with hearing changes arise or per MD recommendation., Consider a communication needs assessment after medical clearance for hearing aids is obtained.   Swain Acree MARIE LEROUX-MARTINEZ, AUD
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
## 2023-09-02 NOTE — Patient Instructions (Signed)
 I have ordered an imaging study for you to complete prior to your next visit. Please call [facility] Scheduling at [phone] to schedule your imaging if you have not received a call within 24 hours. If you are unable to complete your imaging study prior to your next scheduled visit please call our office to let us know.

## 2023-09-04 DIAGNOSIS — F411 Generalized anxiety disorder: Secondary | ICD-10-CM | POA: Diagnosis not present

## 2023-09-04 DIAGNOSIS — Z600 Problems of adjustment to life-cycle transitions: Secondary | ICD-10-CM | POA: Diagnosis not present

## 2023-09-17 DIAGNOSIS — F411 Generalized anxiety disorder: Secondary | ICD-10-CM | POA: Diagnosis not present

## 2023-09-17 DIAGNOSIS — Z600 Problems of adjustment to life-cycle transitions: Secondary | ICD-10-CM | POA: Diagnosis not present

## 2023-09-24 DIAGNOSIS — Z600 Problems of adjustment to life-cycle transitions: Secondary | ICD-10-CM | POA: Diagnosis not present

## 2023-09-24 DIAGNOSIS — F411 Generalized anxiety disorder: Secondary | ICD-10-CM | POA: Diagnosis not present

## 2023-10-02 DIAGNOSIS — F411 Generalized anxiety disorder: Secondary | ICD-10-CM | POA: Diagnosis not present

## 2023-10-02 DIAGNOSIS — Z600 Problems of adjustment to life-cycle transitions: Secondary | ICD-10-CM | POA: Diagnosis not present

## 2023-10-13 DIAGNOSIS — Z600 Problems of adjustment to life-cycle transitions: Secondary | ICD-10-CM | POA: Diagnosis not present

## 2023-10-13 DIAGNOSIS — F411 Generalized anxiety disorder: Secondary | ICD-10-CM | POA: Diagnosis not present

## 2023-10-25 ENCOUNTER — Ambulatory Visit (HOSPITAL_BASED_OUTPATIENT_CLINIC_OR_DEPARTMENT_OTHER)

## 2023-10-26 ENCOUNTER — Ambulatory Visit (INDEPENDENT_AMBULATORY_CARE_PROVIDER_SITE_OTHER): Admitting: Otolaryngology

## 2023-10-27 DIAGNOSIS — F411 Generalized anxiety disorder: Secondary | ICD-10-CM | POA: Diagnosis not present

## 2023-10-27 DIAGNOSIS — Z600 Problems of adjustment to life-cycle transitions: Secondary | ICD-10-CM | POA: Diagnosis not present

## 2023-11-05 DIAGNOSIS — Z600 Problems of adjustment to life-cycle transitions: Secondary | ICD-10-CM | POA: Diagnosis not present

## 2023-11-05 DIAGNOSIS — F411 Generalized anxiety disorder: Secondary | ICD-10-CM | POA: Diagnosis not present

## 2023-11-26 DIAGNOSIS — L089 Local infection of the skin and subcutaneous tissue, unspecified: Secondary | ICD-10-CM | POA: Diagnosis not present

## 2023-11-26 DIAGNOSIS — L0202 Furuncle of face: Secondary | ICD-10-CM | POA: Diagnosis not present

## 2023-12-31 DIAGNOSIS — F411 Generalized anxiety disorder: Secondary | ICD-10-CM | POA: Diagnosis not present

## 2023-12-31 DIAGNOSIS — Z600 Problems of adjustment to life-cycle transitions: Secondary | ICD-10-CM | POA: Diagnosis not present

## 2024-01-06 DIAGNOSIS — F411 Generalized anxiety disorder: Secondary | ICD-10-CM | POA: Diagnosis not present

## 2024-01-06 DIAGNOSIS — Z600 Problems of adjustment to life-cycle transitions: Secondary | ICD-10-CM | POA: Diagnosis not present

## 2024-01-19 DIAGNOSIS — F411 Generalized anxiety disorder: Secondary | ICD-10-CM | POA: Diagnosis not present

## 2024-01-19 DIAGNOSIS — Z600 Problems of adjustment to life-cycle transitions: Secondary | ICD-10-CM | POA: Diagnosis not present

## 2024-02-02 DIAGNOSIS — F411 Generalized anxiety disorder: Secondary | ICD-10-CM | POA: Diagnosis not present

## 2024-02-02 DIAGNOSIS — Z600 Problems of adjustment to life-cycle transitions: Secondary | ICD-10-CM | POA: Diagnosis not present

## 2024-02-09 ENCOUNTER — Ambulatory Visit (INDEPENDENT_AMBULATORY_CARE_PROVIDER_SITE_OTHER): Admitting: Family Medicine

## 2024-02-09 VITALS — BP 132/70 | HR 75 | Temp 97.9°F | Ht 76.0 in | Wt 220.4 lb

## 2024-02-09 DIAGNOSIS — M546 Pain in thoracic spine: Secondary | ICD-10-CM | POA: Diagnosis not present

## 2024-02-09 DIAGNOSIS — M545 Low back pain, unspecified: Secondary | ICD-10-CM | POA: Diagnosis not present

## 2024-02-09 MED ORDER — CYCLOBENZAPRINE HCL 5 MG PO TABS: 5.0000 mg | ORAL_TABLET | Freq: Three times a day (TID) | ORAL | 1 refills | Status: DC | PRN

## 2024-02-09 NOTE — Progress Notes (Unsigned)
 Virtual Visit via Video Note  Consent was obtained for video visit:  Yes.   Answered questions that patient had about telehealth interaction:  Yes.   I discussed the limitations, risks, security and privacy concerns of performing an evaluation and management service by telemedicine. I also discussed with the patient that there may be a patient responsible charge related to this service. The patient expressed understanding and agreed to proceed.  Pt location: Home Physician Location: office Name of referring provider:  Katrinka Garnette KIDD, MD I connected with Ross Ryan at patients initiation/request on 02/10/2024 at  9:50 AM EDT by video enabled telemedicine application and verified that I am speaking with the correct person using two identifiers. Pt MRN:  [id number] Pt DOB:  Oct 09, 1980 Video Participants:  Ross Ryan  Assessment/Plan:   Episodic cluster headache, stable    If cluster recurs, prescribe prolonged prednisone  taper and restart verapamil  80mg  3 times daily  Sumatriptan  20mg  NS if needed.  Follow up 1 year   Subjective:  Ross Ryan is a 43 year old right-handed male with HTN and depression who follows up for cluster headache.    UPDATE: No recurrent headaches since last visit.   Current NSAIDS/analgesics:  ibuprofen  600mg  Current triptans:  sumatriptan  20mg  NS Current ergotamine:  none Current anti-emetic:  none Current muscle relaxants:  Flexeril  5mg  TID PRN Current Antihypertensive medications:  none Current Antidepressant medications:  none Current Anticonvulsant medications:  none Current anti-CGRP:  none Current Vitamins/Herbal/Supplements:  none Current Antihistamines/Decongestants:  Flonase  Other therapy:  none Hormone/birth control:  none     HISTORY:  Started in June 2022.  Severe shooting pain that starts at the right TMJ and travels up behind the right eye, temple and behind the ear.  Symptoms similar to when he previously had  mastoiditis.  Took antibiotics which was ineffective..  Saw the dentist and underwent root canal which was ineffective.  He has associated blurred vision in right eye, right eye lacrimation, right-sided rhinorrhea, and photophobia.  No nausea, vomiting, ptosis, conjunctival injection, speech disturbance, numbness or weakness.  It lasts 1 hour with sumatriptan  100mg  tablet, otherwise up to 3 hours.  It occurs 1 to 2 times a day.  When he has it, he cannot keep still (paces, rocks back and forth).  Alcohol and marijuana smoke are triggers.  Nothing really relieves it.  He went to the ED on 11/15/2020 and 11/22/2020 for this.  CT head on 11/22/2020 personally reviewed showed bilateral opacification of the bilateral mastoids but no acute findings. MRI brain with and without contrast on 04/10/2022 showed chronic opacification of mastoid air cells and petrous apex bilaterally but normal brain.  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  Follow up CTA of head on 04/29/2022 was not able to clarify finding, whether infundibulum vs small aneurysm. Repeat CTA of head on 10/28/2022 indicated that the previously noted outpouching from the distal right ICA appeared normal with no evidence of aneurysm, and perhaps the finding was due to somewhat tortuosity of the PCOMM origin.   Past NSAIDS/analgesics:  meloxicam , naproxen  Past abortive triptans:  sumatriptan  100mg  Past abortive ergotamine:  none Past muscle relaxants:  Flexeril , Robaxin  Past anti-emetic:  Reglan  Past antihypertensive medications:  verapamil  (helped -restart as needed), amlodipine  Past antidepressant medications:  fluoxetine, mirtazpine Past anticonvulsant medications:  none Past anti-CGRP:  none Past vitamins/Herbal/Supplements:  none Past antihistamines/decongestants:  Flonase  Other past therapies:  prednisone   Past Medical History: Past Medical History:  Diagnosis Date    Hypertension 12/2016   Migraines     Medications: Outpatient Encounter Medications as of 02/10/2024  Medication Sig Note   fluticasone  (FLONASE ) 50 MCG/ACT nasal spray Place 1 spray into both nostrils daily.    lamoTRIgine (LAMICTAL) 25 MG tablet Take 25 mg by mouth daily. 08/10/2023: Haven't started yet   omeprazole  (PRILOSEC) 40 MG capsule TAKE ONE CAPSULE BY MOUTH EVERY MORNING BEFORE BREAKFAST    SUMAtriptan  (IMITREX ) 20 MG/ACT nasal spray Place 1 spray (20 mg total) into the nose as needed for migraine or headache. May repeat in 1 hour.  Maximum 2 doses in 24 hours.    No facility-administered encounter medications on file as of 02/10/2024.    Allergies: No Known Allergies  Family History: Family History  Problem Relation Age of Onset   Hypertension Mother    Heart disease Mother        in her 68s   Diabetes Father    Hyperlipidemia Father    Hypertension Father    Healthy Sister    Healthy Brother    Dementia Maternal Grandmother    Other Maternal Grandfather        doesnt know cause of death   Prostate cancer Maternal Grandfather        16s   Heart disease Paternal Grandmother    Other Paternal Grandfather        doesnt know cause of death   Prostate cancer Maternal Uncle        in 56s   Prostate cancer Cousin        in 31s    Observations/Objective:   No acute distress.  Alert and oriented.  Speech fluent and not dysarthric.  Language intact.  Eyes orthophoric on primary gaze.  Face symmetric.   Follow Up Instructions:    -I discussed the assessment and treatment plan with the patient. The patient was provided an opportunity to ask questions and all were answered. The patient agreed with the plan and demonstrated an understanding of the instructions.   The patient was advised to call back or seek an in-person evaluation if the symptoms worsen or if the condition fails to improve as anticipated.   Juliene Lamar Dunnings, DO

## 2024-02-09 NOTE — Patient Instructions (Addendum)
 We have placed a referral for you today to sports medicine - please call their # if you do not hear within a week (may be listed below or you may see mychart message within a few days with #).   Trial flexeril  as needed- be cautious with driving but can drive if feel no sedation  Recommended follow up: Return for as needed for new, worsening, persistent symptoms. -definitely let me know if new symptoms such as fever, worsening pain, leg weakness, tingling in groin, incontinence

## 2024-02-09 NOTE — Progress Notes (Signed)
 Phone [phone] In person visit   Subjective:   Ross Ryan is a 43 y.o. year old very pleasant male patient who presents for/with See problem oriented charting Chief Complaint  Patient presents with   Back Pain    X1 week; no injury that he is aware of;     Discussed the use of AI scribe software for clinical note transcription with the patient, who gave verbal consent to proceed.  History of Present Illness   Ross Ryan is a 43 year old male who presents with low back pain.  He has been experiencing low left back pain as well as left-sided thoracic back pain for approximately ten days, which began after a workout routine. The pain developed a couple of days later and is primarily located on the left lower back, occasionally radiating to his side. It is described as severe at times, especially when sitting straight up, in a car seat, or twisting. Relief is found when leaning back in a chair or lying down.  Denies trauma or injury or lifting something heavy.  He does lift in the gym and had lifted a few days prior to this starting but did not notice any correlation-he stopped lifting since that time to be cautious  He has been managing the pain with Tylenol  and Advil , but these have not provided significant relief. He has also tried heat and cold applications without much effect. There is no history of back imaging or previous back pain issues requiring x-rays.  ROS-No saddle anesthesia, bladder incontinence, fecal incontinence, weakness in extremity, numbness or tingling in extremity. History negative for trauma, history of cancer, fever, chills, unintentional weight loss, recent bacterial infection, recent IV drug use, HIV, pain worse at night or while supine.    His past medical history includes hypertension. He is also prescribed omeprazole  for reflux and sumatriptan  for migraines. He has a prescription for Lamictal, which he has not started, and is not currently on any  medication for mental health purposes.   Past Medical History-  Patient Active Problem List   Diagnosis Date Noted   Cluster headache 05/03/2021    Priority: Medium    GAD (generalized anxiety disorder) 05/03/2021    Priority: Medium    Gastroesophageal reflux disease without esophagitis 11/02/2018    Priority: Medium    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/05/2018    Priority: Medium    Hypertension 07/05/2018    Priority: Medium    Throat irritation 07/05/2018    Priority: Low   Weight loss 07/05/2018    Priority: Low   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[facility]  04/02/2017    Priority: Low   Vitamin B12 deficiency - oral repletion  02/02/2017    Priority: Low   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 40 tablet 1   fluticasone  (FLONASE ) 50 MCG/ACT nasal spray Place 1 spray into both nostrils daily. (Patient taking differently: Place 1 spray into both nostrils as needed.) 15.8 mL 0   omeprazole  (PRILOSEC) 40 MG capsule TAKE ONE CAPSULE BY MOUTH EVERY MORNING BEFORE BREAKFAST 90 capsule 3   SUMAtriptan  (IMITREX ) 20 MG/ACT nasal spray Place 1 spray (20 mg total)  into the nose as needed for migraine or headache. May repeat in 1 hour.  Maximum 2 doses in 24 hours. 6 each 5   No current facility-administered medications for this visit.     Objective:  BP 132/70 (BP Location: Left Arm, Patient Position: Sitting, Cuff Size: Normal)   Pulse 75   Temp 97.9 F (36.6 C) (Temporal)   Ht 6' 4 (1.93 m)   Wt 220 lb 6.4 oz (100 kg)   SpO2 97%   BMI 26.83 kg/m  Gen: NAD, resting comfortably CV: RRR no murmurs rubs or gallops Lungs: CTAB no crackles, wheeze,  rhonchi Abdomen: soft/nontender/nondistended/normal bowel sounds. No rebound or guarding.  Ext: no edema Skin: warm, dry Back - Normal skin, Spine with normal alignment and no deformity.  No tenderness to vertebral process palpation.  Paraspinous muscles are tender and with spasm on the left side-normal on the right side.   Range of motion is full at neck and lumbar sacral regions. Negative Straight leg raise.  Neuro- no saddle anesthesia, 5/5 strength lower extremities     Assessment and Plan   # Thoracolumbar back strain with muscle spasm, primarily affecting the left lower back, with pain exacerbated by sitting straight up and twisting.  onset approximately 10 days ago, with no clear trigger.  No radicular symptoms or red flag symptoms such as numbness, tingling, or bowel/bladder incontinence. Pain is positional, with relief when leaning back or lying down. Examination reveals tenderness and tightness in the paraspinous muscles from thoracic to lumbar spine on the left only. No midline tenderness or signs of sciatica. Condition expected to last up to six weeks. - Prescribe Flexeril  5 mg, three times a day as needed, with caution regarding drowsiness, especially during daytime. Advise to take after work if needed. - Advise against driving while on Flexeril  until he knows how it affects him. - Consider physical therapy if no improvement in 2-3 weeks. -We discussed possible referral to sports medicine at later date but he is interested in potential for osteopathic manipulation and request referral at this time-referral was placed for you to Dr. Leonce or Dr. Claudene  Hypertension Hypertension is currently well-controlled without medication. Blood pressure was within normal range during the visit.      Recommended follow up: Return for as needed for new, worsening, persistent symptoms. Future Appointments  Date Time Provider Department Center  02/10/2024  9:50 AM Skeet Juliene SAUNDERS, DO [facility] None   02/10/2024  2:00 PM Magdalen Pasco RAMAN, DPM [facility]  06/27/2024  9:00 AM Katrinka Garnette KIDD, MD [facility]    Lab/Order associations:   ICD-10-CM   1. Acute left-sided thoracic back pain  M54.6 Ambulatory referral to Sports Medicine    2. Pain in left lumbar region of back  M54.50 Ambulatory referral to Sports Medicine      Meds ordered this encounter  Medications   cyclobenzaprine  (FLEXERIL ) 5 MG tablet    Sig: Take 1 tablet (5 mg total) by mouth 3 (three) times daily as needed for muscle spasms (back spasms).    Dispense:  40 tablet    Refill:  1    Return precautions advised.  Garnette Katrinka, MD

## 2024-02-10 ENCOUNTER — Telehealth: Admitting: Neurology

## 2024-02-10 ENCOUNTER — Ambulatory Visit: Payer: Self-pay | Admitting: Podiatry

## 2024-02-10 ENCOUNTER — Encounter: Payer: Self-pay | Admitting: Neurology

## 2024-02-10 DIAGNOSIS — G44019 Episodic cluster headache, not intractable: Secondary | ICD-10-CM | POA: Diagnosis not present

## 2024-02-16 NOTE — Progress Notes (Unsigned)
 Ross Ryan D.[facility] [address] [place] [address] Phone: [phone]   Assessment and Plan:     1. Acute bilateral thoracic back pain (Primary) 2. Acute left-sided low back pain without sciatica -Acute, initial visit - Most consistent with muscular strain of left thoracolumbar region from day-to-day physical activity.  Differential includes kidney stone, though unlikely based on physical exam and HPI - Start meloxicam  15 mg daily x2 weeks.  If still having pain after 2 weeks, complete 3rd-week of NSAID. May use remaining NSAID as needed once daily for pain control.  Do not to use additional over-the-counter NSAIDs (ibuprofen , naproxen , Advil , Aleve , etc.) while taking prescription NSAIDs.  May use Tylenol  [phone] mg 2 to 3 times a day for breakthrough pain. - Start HEP for low back - Discussed OMT.  I am concerned with patient's acute pain that OMT may further flare symptoms, so we elected to not perform OMT at today's visit, but could consider at follow-up  15 additional minutes spent for educating Therapeutic Home Exercise Program.  This included exercises focusing on stretching, strengthening, with focus on eccentric aspects.   Long term goals include an improvement in range of motion, strength, endurance as well as avoiding reinjury. Patient's frequency would include in 1-2 times a day, 3-5 times a week for a duration of 6-12 weeks. Proper technique shown and discussed handout in great detail with ATC.  All questions were discussed and answered.     Pertinent previous records reviewed include primary care note 02/09/2024   Follow Up: 4 weeks for reevaluation.  Could consider OMT versus physical therapy   Subjective:   I, Ross Ryan am a scribe for Dr. Leonce.    Chief Complaint: back pain   HPI:   02/17/2024 Patient is a 43 year old male with back pain. Patient states that he may have picked up his daughter worse but can't  think of anything else that might be the cause of the pain. Pain doesn't wake him up at night. It actually feels better when he is laying down.   Duration? 3 weeks Did you have an Injury to cause this pain? unknown Taking Medication for pain? Advil , muscle relaxer Numbness or Tingling?no Does the pain Radiate? Yes sideways Altered gait or use? Bending twisting sitting ROM/ impairment of movement?yes   Relevant Historical Information: GERD, hypertension  Additional pertinent review of systems negative.   Current Outpatient Medications:    cyclobenzaprine  (FLEXERIL ) 5 MG tablet, Take 1 tablet (5 mg total) by mouth 3 (three) times daily as needed for muscle spasms (back spasms)., Disp: 40 tablet, Rfl: 1   fluticasone  (FLONASE ) 50 MCG/ACT nasal spray, Place 1 spray into both nostrils daily., Disp: 15.8 mL, Rfl: 0   omeprazole  (PRILOSEC) 40 MG capsule, TAKE ONE CAPSULE BY MOUTH EVERY MORNING BEFORE BREAKFAST, Disp: 90 capsule, Rfl: 3   SUMAtriptan  (IMITREX ) 20 MG/ACT nasal spray, Place 1 spray (20 mg total) into the nose as needed for migraine or headache. May repeat in 1 hour.  Maximum 2 doses in 24 hours., Disp: 6 each, Rfl: 5   Objective:     Vitals:   02/17/24 1000  BP: 110/70  Pulse: 67  SpO2: 97%  Weight: 222 lb 6.4 oz (100.9 kg)  Height: 6' 4 (1.93 m)      Body mass index is 27.07 kg/m.    Physical Exam:    Gen: Appears well, nad, nontoxic and pleasant Psych: Alert and oriented, appropriate  mood and affect Neuro: sensation intact, strength is 5/5 in upper and lower extremities, muscle tone wnl Skin: no susupicious lesions or rashes  Back - Normal skin, Spine with normal alignment and no deformity.   No tenderness to vertebral process palpation.   Paraspinous muscles are not tender and without spasm Negative Lloyd sign NTTP gluteal musculature Straight leg raise negative Trendelenberg negative Piriformis Test negative Gait normal  Left thoracolumbar pain with  lumbar extension, flexion, rotation  Electronically signed by:  Odis Mace [facility] 10:16 AM 02/17/24

## 2024-02-17 ENCOUNTER — Ambulatory Visit: Admitting: Sports Medicine

## 2024-02-17 ENCOUNTER — Encounter: Payer: Self-pay | Admitting: Podiatry

## 2024-02-17 ENCOUNTER — Ambulatory Visit: Admitting: Podiatry

## 2024-02-17 VITALS — BP 110/70 | HR 67 | Ht 76.0 in | Wt 222.4 lb

## 2024-02-17 DIAGNOSIS — M2042 Other hammer toe(s) (acquired), left foot: Secondary | ICD-10-CM

## 2024-02-17 DIAGNOSIS — M2041 Other hammer toe(s) (acquired), right foot: Secondary | ICD-10-CM

## 2024-02-17 DIAGNOSIS — D492 Neoplasm of unspecified behavior of bone, soft tissue, and skin: Secondary | ICD-10-CM | POA: Diagnosis not present

## 2024-02-17 DIAGNOSIS — M545 Low back pain, unspecified: Secondary | ICD-10-CM | POA: Diagnosis not present

## 2024-02-17 DIAGNOSIS — M546 Pain in thoracic spine: Secondary | ICD-10-CM

## 2024-02-17 MED ORDER — MELOXICAM 15 MG PO TABS: 15.0000 mg | ORAL_TABLET | Freq: Every day | ORAL | 0 refills | Status: DC

## 2024-02-17 NOTE — Patient Instructions (Addendum)
-   Start meloxicam  15 mg daily x2 weeks.  If still having pain after 2 weeks, complete 3rd-week of NSAID. May use remaining NSAID as needed once daily for pain control.  Do not to use additional over-the-counter NSAIDs (ibuprofen , naproxen , Advil , Aleve , etc.) while taking prescription NSAIDs.  May use Tylenol  [phone] mg 2 to 3 times a day for breakthrough pain.  Low Back HEP.  Follow up in 4 weeks.

## 2024-02-18 DIAGNOSIS — Z600 Problems of adjustment to life-cycle transitions: Secondary | ICD-10-CM | POA: Diagnosis not present

## 2024-02-18 DIAGNOSIS — F411 Generalized anxiety disorder: Secondary | ICD-10-CM | POA: Diagnosis not present

## 2024-02-18 NOTE — Progress Notes (Signed)
 Subjective:   Patient ID: Ross Ryan, male   DOB: 42 y.o.   MRN: [id number]   HPI Patient presents with lesions on both feet that have been very tender and he states has been recent that it occurred and he does not remember injury.  States left over right and hard to walk on.  Patient does not smoke tries to be active   Review of Systems  All other systems reviewed and are negative.       Objective:  Physical Exam Vitals and nursing note reviewed.  Constitutional:      Appearance: He is well-developed.  Pulmonary:     Effort: Pulmonary effort is normal.  Musculoskeletal:        General: Normal range of motion.  Skin:    General: Skin is warm.  Neurological:     Mental Status: He is alert.     Neurovascular status intact muscle strength was found to be adequate range of motion adequate with lesions subfifth metatarsal of both feet that upon debridement show pinpoint bleeding and pain to lateral pressure.  Patient is noted to have good digital perfusion well oriented x 3     Assessment:  Probability for benign neoplasm formation cannot rule out bony structural issues 8     Plan:  NP reviewed and at this point Sharp sterile debridement accomplished and I went ahead applied chemical agent to create immune response with sterile dressings and explained what to do if blistering were to occur.  May require more aggressive treatments depending on response and will be seen back as symptoms indicate

## 2024-03-15 NOTE — Progress Notes (Unsigned)
    [facility] [address] [place] [address] Phone: [phone]   Assessment and Plan:     1. Acute bilateral thoracic back pain (Primary) 2. Acute left-sided low back pain without sciatica - Subacute, improving, subsequent visit - Overall significant improvement in symptoms with completing meloxicam  course, starting HEP.  Consistent with resolving flare of lumbar muscular strain - Use meloxicam  15 mg daily as needed for breakthrough pain.  Recommend limiting chronic NSAIDs to 1-2 doses per week to prevent long-term side effects. Use Tylenol  500 to 1000 mg tablets 2-3 times a day as needed for day-to-day pain relief.    - Continue HEP to prevent recurrence of pain and to improve posture    Pertinent previous records reviewed include none   Follow Up: As needed.  Could obtain lumbar x-ray.  Could discuss physical therapy versus OMT versus repeat NSAID course   Subjective:   I, Ross Ryan, am serving as a Neurosurgeon for Doctor Morene Mace  Chief Complaint: back pain    HPI:    02/17/2024 Patient is a 43 year old male with back pain. Patient states that he may have picked up his daughter worse but can't think of anything else that might be the cause of the pain. Pain doesn't wake him up at night. It actually feels better when he is laying down.    Duration? 3 weeks Did you have an Injury to cause this pain? unknown Taking Medication for pain? Advil , muscle relaxer Numbness or Tingling?no Does the pain Radiate? Yes sideways Altered gait or use? Bending twisting sitting ROM/ impairment of movement?yes   03/16/2024 Patient states he is feeling better   Relevant Historical Information: GERD, hypertension  Additional pertinent review of systems negative.   Current Outpatient Medications:    cyclobenzaprine  (FLEXERIL ) 5 MG tablet, Take 1 tablet (5 mg total) by mouth 3 (three) times daily as needed for muscle spasms  (back spasms)., Disp: 40 tablet, Rfl: 1   fluticasone  (FLONASE ) 50 MCG/ACT nasal spray, Place 1 spray into both nostrils daily., Disp: 15.8 mL, Rfl: 0   meloxicam  (MOBIC ) 15 MG tablet, Take 1 tablet (15 mg total) by mouth daily., Disp: 30 tablet, Rfl: 0   omeprazole  (PRILOSEC) 40 MG capsule, TAKE ONE CAPSULE BY MOUTH EVERY MORNING BEFORE BREAKFAST, Disp: 90 capsule, Rfl: 3   SUMAtriptan  (IMITREX ) 20 MG/ACT nasal spray, Place 1 spray (20 mg total) into the nose as needed for migraine or headache. May repeat in 1 hour.  Maximum 2 doses in 24 hours., Disp: 6 each, Rfl: 5   Objective:     Vitals:   03/16/24 1037  BP: 132/80  Pulse: 76  SpO2: 97%  Weight: 226 lb (102.5 kg)  Height: 6' 4 (1.93 m)      Body mass index is 27.51 kg/m.    Physical Exam:    Gen: Appears well, nad, nontoxic and pleasant Psych: Alert and oriented, appropriate mood and affect Neuro: sensation intact, strength is 5/5 in upper and lower extremities, muscle tone wnl Skin: no susupicious lesions or rashes  Back - Normal skin, Spine with normal alignment and no deformity.   No tenderness to vertebral process palpation.   Paraspinous muscles are not tender and without spasm NTTP gluteal musculature Straight leg raise negative Trendelenberg negative Piriformis Test negative Gait normal    Electronically signed by:  Odis Mace [facility] 10:43 AM 03/16/24

## 2024-03-16 ENCOUNTER — Ambulatory Visit: Admitting: Sports Medicine

## 2024-03-16 VITALS — BP 132/80 | HR 76 | Ht 76.0 in | Wt 226.0 lb

## 2024-03-16 DIAGNOSIS — M545 Low back pain, unspecified: Secondary | ICD-10-CM

## 2024-03-16 DIAGNOSIS — M546 Pain in thoracic spine: Secondary | ICD-10-CM | POA: Diagnosis not present

## 2024-03-16 NOTE — Patient Instructions (Signed)
-   Use meloxicam  15 mg daily as needed for breakthrough pain.  Recommend limiting chronic NSAIDs to 1-2 doses per week to prevent long-term side effects. Use Tylenol  500 to 1000 mg tablets 2-3 times a day as needed for day-to-day pain relief.    Continue HEP to help posture   As needed follow up

## 2024-04-04 ENCOUNTER — Encounter: Payer: Self-pay | Admitting: Neurology

## 2024-04-06 ENCOUNTER — Other Ambulatory Visit: Payer: Self-pay | Admitting: Neurology

## 2024-04-06 DIAGNOSIS — G44019 Episodic cluster headache, not intractable: Secondary | ICD-10-CM

## 2024-04-06 MED ORDER — VERAPAMIL HCL 80 MG PO TABS: 80.0000 mg | ORAL_TABLET | Freq: Three times a day (TID) | ORAL | 2 refills | Status: DC

## 2024-04-07 ENCOUNTER — Emergency Department (HOSPITAL_BASED_OUTPATIENT_CLINIC_OR_DEPARTMENT_OTHER)

## 2024-04-07 ENCOUNTER — Emergency Department (HOSPITAL_BASED_OUTPATIENT_CLINIC_OR_DEPARTMENT_OTHER): Admitting: Radiology

## 2024-04-07 ENCOUNTER — Emergency Department (HOSPITAL_BASED_OUTPATIENT_CLINIC_OR_DEPARTMENT_OTHER): Admission: EM | Admit: 2024-04-07 | Discharge: 2024-04-07 | Disposition: A | Discharge status: Home / Self Care

## 2024-04-07 ENCOUNTER — Other Ambulatory Visit: Payer: Self-pay

## 2024-04-07 DIAGNOSIS — I10 Essential (primary) hypertension: Secondary | ICD-10-CM | POA: Insufficient documentation

## 2024-04-07 DIAGNOSIS — Z79899 Other long term (current) drug therapy: Secondary | ICD-10-CM | POA: Insufficient documentation

## 2024-04-07 DIAGNOSIS — R079 Chest pain, unspecified: Secondary | ICD-10-CM | POA: Insufficient documentation

## 2024-04-07 DIAGNOSIS — R519 Headache, unspecified: Secondary | ICD-10-CM | POA: Insufficient documentation

## 2024-04-07 DIAGNOSIS — R0789 Other chest pain: Secondary | ICD-10-CM | POA: Diagnosis not present

## 2024-04-07 DIAGNOSIS — H538 Other visual disturbances: Secondary | ICD-10-CM | POA: Diagnosis not present

## 2024-04-07 LAB — BASIC METABOLIC PANEL WITH GFR
Anion gap: 12 (ref 5–15)
BUN: 14 mg/dL (ref 6–20)
CO2: 26 mmol/L (ref 22–32)
Calcium: 10 mg/dL (ref 8.9–10.3)
Chloride: 102 mmol/L (ref 98–111)
Creatinine, Ser: 1.24 mg/dL (ref 0.61–1.24)
GFR, Estimated: >60 mL/min (ref >60)
Glucose, Bld: 123 mg/dL — ABNORMAL HIGH (ref 70–99)
Potassium: 3.2 mmol/L — ABNORMAL LOW (ref 3.5–5.1)
Sodium: 140 mmol/L (ref 135–145)

## 2024-04-07 LAB — CBC
HCT: 40.5 % (ref 39.0–52.0)
Hemoglobin: 13 g/dL (ref 13.0–17.0)
MCH: 25.8 pg — ABNORMAL LOW (ref 26.0–34.0)
MCHC: 32.1 g/dL (ref 30.0–36.0)
MCV: 80.5 fL (ref 80.0–100.0)
Platelets: 244 K/uL (ref 150–400)
RBC: 5.03 MIL/uL (ref 4.22–5.81)
RDW: 14.2 % (ref 11.5–15.5)
WBC: 8.6 K/uL (ref 4.0–10.5)
nRBC: 0 % (ref 0.0–0.2)

## 2024-04-07 LAB — TROPONIN T, HIGH SENSITIVITY
Troponin T High Sensitivity: <15 ng/L (ref 0–19)
Troponin T High Sensitivity: <15 ng/L (ref 0–19)

## 2024-04-07 MED ORDER — KETOROLAC TROMETHAMINE 15 MG/ML IJ SOLN
15.0000 mg | Freq: Once | INTRAMUSCULAR | Status: AC
  Administered 2024-04-07: 15 mg via INTRAVENOUS
  Filled 2024-04-07: qty 1

## 2024-04-07 MED ORDER — POTASSIUM CHLORIDE CRYS ER 20 MEQ PO TBCR
40.0000 meq | EXTENDED_RELEASE_TABLET | Freq: Once | ORAL | Status: AC
  Administered 2024-04-07: 40 meq via ORAL
  Filled 2024-04-07: qty 2

## 2024-04-07 MED ORDER — ACETAMINOPHEN 500 MG PO TABS
1000.0000 mg | ORAL_TABLET | Freq: Once | ORAL | Status: AC
Start: 2024-04-07 — End: 2024-04-07
  Administered 2024-04-07: 1000 mg via ORAL
  Filled 2024-04-07: qty 2

## 2024-04-07 MED ORDER — PROCHLORPERAZINE EDISYLATE 10 MG/2ML IJ SOLN
10.0000 mg | Freq: Once | INTRAMUSCULAR | Status: AC
  Administered 2024-04-07: 10 mg via INTRAVENOUS
  Filled 2024-04-07: qty 2

## 2024-04-07 NOTE — ED Provider Notes (Signed)
 [place] EMERGENCY DEPARTMENT AT [facility] Provider Note   CSN: [id number] Arrival date & time: 04/07/24  1853     Patient presents with: Chest Pain   Ross Ryan is a 43 y.o. male.   Presenting emergency department with headache, also having chest pain earlier.  Reports that he was shaky and sweaty.  Has been having headaches for the past week behind his right eye similar to prior headaches that he had several years ago.  Reports some intermittent blurred vision, none currently.  Had some chest discomfort prior to arrival.  No shortness of breath, no nausea no vomiting.   Chest Pain      Prior to Admission medications   Medication Sig Start Date End Date Taking? Authorizing Provider  cyclobenzaprine  (FLEXERIL ) 5 MG tablet Take 1 tablet (5 mg total) by mouth 3 (three) times daily as needed for muscle spasms (back spasms). 02/09/24   Katrinka Garnette KIDD, MD  fluticasone  (FLONASE ) 50 MCG/ACT nasal spray Place 1 spray into both nostrils daily. 02/27/23   Mayer, Jodi R, NP  meloxicam  (MOBIC ) 15 MG tablet Take 1 tablet (15 mg total) by mouth daily. 02/17/24   Leonce Katz, DO  omeprazole  (PRILOSEC) 40 MG capsule TAKE ONE CAPSULE BY MOUTH EVERY MORNING BEFORE BREAKFAST 11/26/22   Katrinka Garnette KIDD, MD  SUMAtriptan  (IMITREX ) 20 MG/ACT nasal spray Place 1 spray (20 mg total) into the nose as needed for migraine or headache. May repeat in 1 hour.  Maximum 2 doses in 24 hours. 08/10/23   Skeet Juliene JONELLE, DO  verapamil  (CALAN ) 80 MG tablet Take 1 tablet (80 mg total) by mouth 3 (three) times daily. 04/06/24   Leigh Venetia CROME, MD    Allergies: Patient has no known allergies.    Review of Systems  Cardiovascular:  Positive for chest pain.    Updated Vital Signs BP (!) 148/96   Pulse 63   Temp 97.7 F (36.5 C) (Oral)   Resp 16   SpO2 100%   Physical Exam Vitals and nursing note reviewed.  Constitutional:      General: He is not in acute distress.    Appearance: He is not  toxic-appearing.  HENT:     Head: Normocephalic.  Cardiovascular:     Rate and Rhythm: Normal rate and regular rhythm.     Pulses:          Radial pulses are 2+ on the right side and 2+ on the left side.       Dorsalis pedis pulses are 2+ on the right side and 2+ on the left side.     Heart sounds: Normal heart sounds.  Pulmonary:     Breath sounds: Normal breath sounds.  Abdominal:     Palpations: Abdomen is soft.  Musculoskeletal:     Right lower leg: No edema.     Left lower leg: No edema.  Skin:    General: Skin is warm.     Capillary Refill: Capillary refill takes less than 2 seconds.  Neurological:     General: No focal deficit present.     Mental Status: He is alert and oriented to person, place, and time.     Cranial Nerves: No cranial nerve deficit.     Motor: No weakness.     (all labs ordered are listed, but only abnormal results are displayed) Labs Reviewed  BASIC METABOLIC PANEL WITH GFR - Abnormal; Notable for the following components:      Result Value  Potassium 3.2 (*)    Glucose, Bld 123 (*)    All other components within normal limits  CBC - Abnormal; Notable for the following components:   MCH 25.8 (*)    All other components within normal limits  TROPONIN T, HIGH SENSITIVITY  TROPONIN T, HIGH SENSITIVITY    EKG: EKG Interpretation Date/Time:  Thursday April 07 2024 19:04:46 EST Ventricular Rate:  53 PR Interval:  170 QRS Duration:  100 QT Interval:  434 QTC Calculation: 407 R Axis:   -14  Text Interpretation: Sinus bradycardia Nonspecific ST abnormality Inverted T waves have replaced nonspecific T wave abnormality in Inferior leads Confirmed by Neysa Clap [phone]) on 04/07/2024 7:08:40 PM  Radiology: CT Head Wo Contrast Result Date: 04/07/2024 EXAM: CT HEAD WITHOUT CONTRAST 04/07/2024 08:33:15 PM TECHNIQUE: CT of the head was performed without the administration of intravenous contrast. Automated exposure control, iterative  reconstruction, and/or weight based adjustment of the mA/kV was utilized to reduce the radiation dose to as low as reasonably achievable. COMPARISON: 10/28/2022 CLINICAL HISTORY: Headache, increasing frequency or severity. FINDINGS: BRAIN AND VENTRICLES: No acute hemorrhage. No evidence of acute infarct. No hydrocephalus. No extra-axial collection. No mass effect or midline shift. ORBITS: No acute abnormality. SINUSES: No acute abnormality. SOFT TISSUES AND SKULL: No acute soft tissue abnormality. No skull fracture. IMPRESSION: 1. No acute intracranial abnormality. Electronically signed by: Pinkie Pebbles MD 04/07/2024 08:38 PM EST RP Workstation: [id number]   DG Chest 2 View Result Date: 04/07/2024 EXAM: 2 VIEW(S) XRAY OF THE CHEST 04/07/2024 07:40:00 PM COMPARISON: 11 / 23 / 23 CLINICAL HISTORY: chest pain FINDINGS: LUNGS AND PLEURA: No focal pulmonary opacity. No pleural effusion. No pneumothorax. HEART AND MEDIASTINUM: No acute abnormality of the cardiac and mediastinal silhouettes. BONES AND SOFT TISSUES: No acute osseous abnormality. IMPRESSION: 1. No acute cardiopulmonary process detected. Electronically signed by: Norman Gatlin MD 04/07/2024 07:46 PM EST RP Workstation: [id number]     Procedures   Medications Ordered in the ED  potassium chloride  SA (KLOR-CON  M) CR tablet 40 mEq (has no administration in time range)  prochlorperazine (COMPAZINE) injection 10 mg (10 mg Intravenous Given 04/07/24 2112)  ketorolac  (TORADOL ) 15 MG/ML injection 15 mg (15 mg Intravenous Given 04/07/24 2113)  acetaminophen  (TYLENOL ) tablet 1,000 mg (1,000 mg Oral Given 04/07/24 2111)    Clinical Course as of 04/07/24 2209  Thu Apr 07, 2024  2205 Patient's headache is greatly improved after medications.  Troponin is negative x 2.  EKG without ischemic changes.  Do not suspect PE clinically, also low risk for PE based off Wells criteria.  Basic metabolic with mildly low potassium.  Will replete.  CT head without  acute abnormalities.  Chest x-ray without pneumonia or pneumothorax.  Given patient's overall reassuring workup, will discharge in stable condition to follow-up with PCP and his neurologist regarding his headaches. [TY]    Clinical Course User Index [TY] Neysa Clap PARAS, DO                                 Medical Decision Making This is a 43 year old male presenting emergency department with headache, also with chest pain.  He is afebrile nontachycardic, was hypertensive on arrival, but improved without intervention.  Does have a past medical history include hypertension migraines and anxiety.  He overall clinically is well-appearing and without focal neurologic deficits to suggest acute stroke.  He is moving his neck freely and without signs  of meningeal irritation.  Low suspicion for infectious/meningitis clinical picture.  He does note headache similar to prior headaches that he has had although not as prolonged or frequent as he has been having the past week.  Will get CT head.  Will also get basic cardiac screening labs, EKG and chest x-ray.  See ED course for further MDM final disposition.  Amount and/or Complexity of Data Reviewed External Data Reviewed:     Details: It appears she has had MRIs in the past that showed possible aneurysm, but subsequent CTAs demonstrated no aneurysm. Labs: ordered. Radiology: ordered and independent interpretation performed.  Risk OTC drugs. Prescription drug management. Decision regarding hospitalization. Diagnosis or treatment significantly limited by social determinants of health.        Final diagnoses:  None    ED Discharge Orders     None          Neysa Caron PARAS, DO 04/07/24 2209

## 2024-04-07 NOTE — ED Triage Notes (Addendum)
 Patient reports chest pain pain that began today. Also endorses headache for the past several days. Patient diaphoretic in triage.

## 2024-04-07 NOTE — Discharge Instructions (Signed)
 Please follow-up with your primary doctor and your neurologist.  Return for fevers, chills, vision loss, facial droop, unilateral weakness, chest pain returns, shortness of breath, lightheadedness, passout or you develop any new or worsening symptoms that are concerning to you.

## 2024-04-08 ENCOUNTER — Other Ambulatory Visit: Payer: Self-pay | Admitting: Neurology

## 2024-04-08 MED ORDER — SUMATRIPTAN 20 MG/ACT NA SOLN: 20.0000 mg | NASAL | 5 refills | Status: DC | PRN

## 2024-04-11 ENCOUNTER — Ambulatory Visit (HOSPITAL_BASED_OUTPATIENT_CLINIC_OR_DEPARTMENT_OTHER)
Admission: RE | Admit: 2024-04-11 | Discharge: 2024-04-11 | Disposition: A | Discharge status: Home / Self Care | Source: Ambulatory Visit | Attending: Otolaryngology | Admitting: Otolaryngology

## 2024-04-11 DIAGNOSIS — H748X3 Other specified disorders of middle ear and mastoid, bilateral: Secondary | ICD-10-CM | POA: Diagnosis not present

## 2024-04-11 DIAGNOSIS — H9012 Conductive hearing loss, unilateral, left ear, with unrestricted hearing on the contralateral side: Secondary | ICD-10-CM | POA: Diagnosis present

## 2024-04-15 ENCOUNTER — Other Ambulatory Visit: Payer: Self-pay | Admitting: Neurology

## 2024-04-15 MED ORDER — SUMATRIPTAN 20 MG/ACT NA SOLN: 20.0000 mg | NASAL | 5 refills | Status: AC | PRN

## 2024-04-15 MED ORDER — PREDNISONE 10 MG PO TABS: ORAL_TABLET | ORAL | 0 refills | Status: DC

## 2024-04-18 ENCOUNTER — Ambulatory Visit (INDEPENDENT_AMBULATORY_CARE_PROVIDER_SITE_OTHER): Admitting: Otolaryngology

## 2024-04-18 ENCOUNTER — Encounter (INDEPENDENT_AMBULATORY_CARE_PROVIDER_SITE_OTHER): Payer: Self-pay | Admitting: Otolaryngology

## 2024-04-18 VITALS — BP 151/93 | HR 64 | Ht 76.0 in | Wt 220.0 lb

## 2024-04-18 DIAGNOSIS — H7292 Unspecified perforation of tympanic membrane, left ear: Secondary | ICD-10-CM

## 2024-04-18 DIAGNOSIS — H6993 Unspecified Eustachian tube disorder, bilateral: Secondary | ICD-10-CM | POA: Diagnosis not present

## 2024-04-18 DIAGNOSIS — H9012 Conductive hearing loss, unilateral, left ear, with unrestricted hearing on the contralateral side: Secondary | ICD-10-CM

## 2024-04-18 MED ORDER — AMOXICILLIN-POT CLAVULANATE 875-125 MG PO TABS: 1.0000 | ORAL_TABLET | Freq: Two times a day (BID) | ORAL | 0 refills | Status: DC
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
## 2024-04-18 NOTE — Patient Instructions (Addendum)
 Take Augmentin  875 mg by mouth (PO) twice daily for 10 days; take with food, take probiotic or yogurt with it Cholesteatoma --- left ear

## 2024-04-20 ENCOUNTER — Encounter (HOSPITAL_BASED_OUTPATIENT_CLINIC_OR_DEPARTMENT_OTHER): Payer: Self-pay | Admitting: Emergency Medicine

## 2024-04-20 ENCOUNTER — Other Ambulatory Visit: Payer: Self-pay

## 2024-04-20 ENCOUNTER — Emergency Department (HOSPITAL_BASED_OUTPATIENT_CLINIC_OR_DEPARTMENT_OTHER)
Admission: EM | Admit: 2024-04-20 | Discharge: 2024-04-20 | Disposition: A | Discharge status: Home / Self Care | Attending: Emergency Medicine | Admitting: Emergency Medicine

## 2024-04-20 DIAGNOSIS — G43909 Migraine, unspecified, not intractable, without status migrainosus: Secondary | ICD-10-CM | POA: Diagnosis present

## 2024-04-20 DIAGNOSIS — G43409 Hemiplegic migraine, not intractable, without status migrainosus: Secondary | ICD-10-CM | POA: Insufficient documentation

## 2024-04-20 DIAGNOSIS — I10 Essential (primary) hypertension: Secondary | ICD-10-CM | POA: Diagnosis not present

## 2024-04-20 MED ORDER — LIDOCAINE HCL 2 % IJ SOLN
10.0000 mL | Freq: Once | INTRAMUSCULAR | Status: AC
  Administered 2024-04-20: 200 mg via INTRADERMAL
  Filled 2024-04-20: qty 20

## 2024-04-20 MED ORDER — DIPHENHYDRAMINE HCL 50 MG/ML IJ SOLN
12.5000 mg | Freq: Once | INTRAMUSCULAR | Status: AC
  Administered 2024-04-20: 12.5 mg via INTRAVENOUS
  Filled 2024-04-20: qty 1

## 2024-04-20 MED ORDER — PROCHLORPERAZINE EDISYLATE 10 MG/2ML IJ SOLN
5.0000 mg | Freq: Once | INTRAMUSCULAR | Status: AC
  Administered 2024-04-20: 5 mg via INTRAVENOUS
  Filled 2024-04-20: qty 2

## 2024-04-20 MED ORDER — KETOROLAC TROMETHAMINE 15 MG/ML IJ SOLN
15.0000 mg | Freq: Once | INTRAMUSCULAR | Status: AC
Start: 2024-04-20 — End: 2024-04-20
  Administered 2024-04-20: 15 mg via INTRAVENOUS
  Filled 2024-04-20: qty 1

## 2024-04-20 MED ORDER — DEXAMETHASONE SOD PHOSPHATE PF 10 MG/ML IJ SOLN
10.0000 mg | Freq: Once | INTRAMUSCULAR | Status: AC
  Administered 2024-04-20: 10 mg via INTRAVENOUS

## 2024-04-20 NOTE — ED Provider Notes (Signed)
  EMERGENCY DEPARTMENT AT [facility] Provider Note  CSN: [id number] Arrival date & time: 04/20/24 9696  Chief Complaint(s) Migraine  HPI Ross Ryan is a 43 y.o. male     Migraine This is a recurrent problem. The current episode started 3 to 5 hours ago. The problem occurs constantly. The problem has not changed since onset.Pertinent negatives include no chest pain, no abdominal pain and no shortness of breath. Nothing relieves the symptoms.   Right sided parietal and retrobulbar pain. Similar to prior headaches. No improvement with Imitrex .  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 Authorizing Provider  amoxicillin -clavulanate (AUGMENTIN ) 875-125 MG tablet Take 1 tablet by mouth 2 (two) times daily. 04/18/24   Tobie Eldora NOVAK, MD  cyclobenzaprine  (FLEXERIL ) 5 MG tablet Take 1 tablet (5 mg total) by mouth 3 (three) times daily as needed for muscle spasms (back spasms). 02/09/24   Katrinka Garnette KIDD, MD  fluticasone  (FLONASE ) 50 MCG/ACT nasal spray Place 1 spray into both nostrils daily. 02/27/23   Mayer, Jodi R, NP  meloxicam  (MOBIC ) 15 MG tablet Take 1 tablet (15 mg total) by mouth daily. 02/17/24   Leonce Katz, DO  omeprazole  (PRILOSEC) 40 MG capsule TAKE ONE CAPSULE  BY MOUTH EVERY MORNING BEFORE BREAKFAST 11/26/22   Katrinka Garnette KIDD, MD  predniSONE  (DELTASONE ) 10 MG tablet TAKE 60MG  DAILY FOR 7 DAYS, THEN 50MG  DAILY FOR 3 DAYS, THEN 40MG  DAILY FOR 3 DAYS, THEN 30MG  DAILY FOR 3 DAYS, THEN 20MG  DAILY FOR 3 DAYS, THEN 10MG  DAILY FOR 3 DAYS. 04/15/24   Skeet Juliene JONELLE, DO  SUMAtriptan  (IMITREX ) 20 MG/ACT nasal spray Place 1 spray (20 mg total) into the nose as needed for migraine or headache. May repeat in 1 hour.  Maximum 2 doses in 24 hours. 04/15/24   Skeet Juliene JONELLE, DO  verapamil  (CALAN ) 80 MG tablet Take 1 tablet (80 mg total) by mouth 3 (three) times daily. 04/06/24   Leigh Venetia CROME, MD                                                                                                                                    Allergies Patient has no known allergies.  Review of Systems Review  of Systems  Respiratory:  Negative for shortness of breath.   Cardiovascular:  Negative for chest pain.  Gastrointestinal:  Negative for abdominal pain.   As noted in HPI  Physical Exam Vital Signs  I have reviewed the triage vital signs BP (!) 172/119 (BP Location: Right Arm)   Pulse (!) 54   Temp (!) 97.4 F (36.3 C) (Oral)   Resp 20   Ht 6' 4 (1.93 m)   Wt 99.8 kg   SpO2 96%   BMI 26.78 kg/m   Physical Exam Vitals reviewed.  Constitutional:      General: He is not in acute distress.    Appearance: He is well-developed. He is not diaphoretic.  HENT:     Head: Normocephalic and atraumatic.     Right Ear: External ear normal.     Left Ear: External ear normal.     Nose: Nose normal.     Mouth/Throat:     Mouth: Mucous membranes are moist.  Eyes:     General: No scleral icterus.    Conjunctiva/sclera: Conjunctivae normal.  Neck:     Trachea: Phonation normal.   Cardiovascular:     Rate and Rhythm: Normal rate and regular rhythm.  Pulmonary:     Effort: Pulmonary effort is normal. No respiratory distress.     Breath sounds: No stridor.  Abdominal:      General: There is no distension.  Musculoskeletal:        General: Normal range of motion.     Cervical back: Normal range of motion. No spinous process tenderness.  Neurological:     Mental Status: He is alert and oriented to person, place, and time.     Cranial Nerves: Cranial nerves 2-12 are intact.     Sensory: Sensation is intact.     Motor: Motor function is intact.  Psychiatric:        Behavior: Behavior normal.     ED Results and Treatments Labs (all labs ordered are listed, but only abnormal results are displayed) Labs Reviewed - No data to display                                                                                                                       EKG  EKG Interpretation Date/Time:    Ventricular Rate:    PR Interval:    QRS Duration:    QT Interval:    QTC Calculation:   R Axis:      Text Interpretation:         Radiology No results found.  Medications Ordered in ED Medications  lidocaine  (XYLOCAINE ) 2 % (with pres) injection 200 mg (200 mg Intradermal Given 04/20/24 0334)  prochlorperazine  (COMPAZINE ) injection 5 mg (5 mg Intravenous Given 04/20/24 0410)  ketorolac  (TORADOL ) 15 MG/ML injection 15 mg (15 mg Intravenous Given 04/20/24 0409)  dexamethasone  (DECADRON ) injection 10 mg (10 mg Intravenous Given 04/20/24 0410)  diphenhydrAMINE  (BENADRYL ) injection 12.5 mg (12.5 mg Intravenous Given 04/20/24 0410)  Procedures Procedures  (including critical care time) Medical Decision Making / ED Course   Medical Decision Making Risk Prescription drug management.     Clinical Course as of 04/20/24 0517  Wed Apr 20, 2024  0345 Typical headache for the patient. Non focal neuro exam.  No fever. Doubt meningitis.  Doubt IIH. No recent head trauma. Doubt intracranial bleed.  No indication for imaging.   Patient does have tenderness to palpation along the lesser occipital nerve region making occipital neuralgia possibility.  Will  attempt occipital nerve block and reassess.  If no improvement, we will move forward with migraine cocktail and reevaluate.    [PC]  0515 Reports significant relief after migraine cocktail. [PC]    Clinical Course User Index [PC] Holle Sprick, Raynell Moder, MD    Final Clinical Impression(s) / ED Diagnoses Final diagnoses:  Hemiplegic migraine without status migrainosus, not intractable   The patient appears reasonably screened and/or stabilized for discharge and I doubt any other medical condition or other [facility] requiring further screening, evaluation, or treatment in the ED at this time. I have discussed the findings, Dx and Tx plan with the patient/family who expressed understanding and agree(s) with the plan. Discharge instructions discussed at length. The patient/family was given strict return precautions who verbalized understanding of the instructions. No further questions at time of discharge.  Disposition: Discharge  Condition: Good  ED Discharge Orders     None        Follow Up: Skeet Juliene SAUNDERS, DO [address] [place] [address] [phone]  Call  to schedule an appointment for close follow up    This chart was dictated using voice recognition software.  Despite best efforts to proofread,  errors can occur which can change the documentation meaning.    Trine Raynell Moder, MD 04/20/24 [phone]

## 2024-04-20 NOTE — ED Triage Notes (Signed)
 Migraine since 2300 last night. Hx of same took advil  and a old Rx med PTA

## 2024-04-22 ENCOUNTER — Other Ambulatory Visit (HOSPITAL_COMMUNITY): Payer: Self-pay

## 2024-04-22 ENCOUNTER — Telehealth: Payer: Self-pay | Admitting: Pharmacy Technician

## 2024-04-22 NOTE — Telephone Encounter (Signed)
 Pharmacy Patient Advocate Encounter  Received notification from [facility] MEDICAID that Prior Authorization for SUMATRIPTAN  6MG  SOL has been CANCELLED due to    PA #/Case ID/Reference #: [id number]

## 2024-04-22 NOTE — Telephone Encounter (Signed)
 Pharmacy Patient Advocate Encounter   Received notification from CoverMyMeds that prior authorization for SUMATRIPTAN  6MG  SOL is required/requested.   Insurance verification completed.   The patient is insured through [facility].   Per test claim: PA required; PA submitted to above mentioned insurance via Latent Key/confirmation #/EOC [id number] Status is pending

## 2024-04-25 ENCOUNTER — Ambulatory Visit: Admitting: Neurology

## 2024-04-25 ENCOUNTER — Telehealth: Payer: Self-pay

## 2024-04-25 DIAGNOSIS — G44019 Episodic cluster headache, not intractable: Secondary | ICD-10-CM | POA: Diagnosis not present

## 2024-04-25 MED ORDER — VERAPAMIL HCL 80 MG PO TABS: ORAL_TABLET | ORAL | 5 refills | Status: DC

## 2024-04-25 NOTE — Telephone Encounter (Signed)
 Prescribe lidocaine  NS 1 spray on affected side, may repeat in 15 minutes (maximum 2 sprays in 24 hours)   Per Pharmacist 15 ML 4% is the normal called in.   She will get that ready for the patient and give him a call.      100% O2 10-15 L/min for 15-20 minutes via nonrebreather face mask   Faxed to [facility]

## 2024-04-25 NOTE — Progress Notes (Signed)
 NEUROLOGY FOLLOW UP OFFICE NOTE  Ross Ryan [id number]  Assessment/Plan:   Episodic cluster headache,     Continue prednisone  taper Migraine prevention:  Increase verapamil  to 80mg  in AM, 80mg  in afternoon and 160mg  at night.  We can increase dose in 2 weeks if needed. Rescue management: Sumatriptan  20mg  NS Prescribe lidocaine  NS 1 spray on affected side, may repeat in 15 minutes (maximum 2 sprays in 24 hours) Prescribe 100% O2 10-15 L/min for 15-20 minutes via nonrebreather face mask Follow up with PCP regarding BP Follow up 3 months.   Subjective:  Ross Ryan is a 43 year old right-handed male with HTN and depression who follows up for cluster headache.    UPDATE: Started having recurrence of daily headaches about a month ago.   Seen in the ED twice.  Restarted verapamil  80mg  three times daily and started prolonged prednisone  taper on 11/21.  Lasting several hours.  Sumatriptan  nasal spray efficacy is variable.  It is still occurring daily, mostly at night.    04/07/2024 EKG:  NSR 53 bpm, PR interval 170 ms, QT/QTc 434/407 ms  Current NSAIDS/analgesics:  ibuprofen  600mg  Current triptans:  sumatriptan  20mg  NS Current ergotamine:  none Current anti-emetic:  none Current muscle relaxants:  Flexeril  5mg  TID PRN Current Antihypertensive medications:  none Current Antidepressant medications:  none Current Anticonvulsant medications:  none Current anti-CGRP:  none Current Vitamins/Herbal/Supplements:  none Current Antihistamines/Decongestants:  Flonase  Other therapy:  none Hormone/birth control:  none     HISTORY:  Started in June 2022.  Severe shooting pain that starts at the right TMJ and travels up behind the right eye, temple and behind the ear.  Symptoms similar to when he previously had mastoiditis.  Took antibiotics which was ineffective..  Saw the dentist and underwent root canal which was ineffective.  He has associated blurred vision in right eye, right  eye lacrimation, right-sided rhinorrhea, and photophobia.  No nausea, vomiting, ptosis, conjunctival injection, speech disturbance, numbness or weakness.  It lasts 1 hour with sumatriptan  100mg  tablet, otherwise up to 3 hours.  It occurs 1 to 2 times a day.  When he has it, he cannot keep still (paces, rocks back and forth).  Alcohol and marijuana smoke are triggers.  Nothing really relieves it.  He went to the ED on 11/15/2020 and 11/22/2020 for this.  CT head on 11/22/2020 personally reviewed showed bilateral opacification of the bilateral mastoids but no acute findings. MRI brain with and without contrast on 04/10/2022 showed chronic opacification of mastoid air cells and petrous apex bilaterally but normal brain.  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  Follow up CTA of head on 04/29/2022 was not able to clarify finding, whether infundibulum vs small aneurysm. Repeat CTA of head on 10/28/2022 indicated that the previously noted outpouching from the distal right ICA appeared normal with no evidence of aneurysm, and perhaps the finding was due to somewhat tortuosity of the PCOMM origin.   Past NSAIDS/analgesics:  meloxicam , naproxen  Past abortive triptans:  sumatriptan  100mg  Past abortive ergotamine:  none Past muscle relaxants:  Flexeril , Robaxin  Past anti-emetic:  Reglan  Past antihypertensive medications:  verapamil  (helped -restart as needed), amlodipine  Past antidepressant medications:  fluoxetine, mirtazpine Past anticonvulsant medications:  none Past anti-CGRP:  none Past vitamins/Herbal/Supplements:  none Past antihistamines/decongestants:  Flonase  Other past therapies:  prednisone   PAST MEDICAL HISTORY: Past Medical History:  Diagnosis Date   Anxiety  Hypertension 12/2016   Migraines     MEDICATIONS: Current Outpatient Medications on File Prior to Visit  Medication Sig Dispense Refill   amoxicillin -clavulanate  (AUGMENTIN ) 875-125 MG tablet Take 1 tablet by mouth 2 (two) times daily. 20 tablet 0   cyclobenzaprine  (FLEXERIL ) 5 MG tablet Take 1 tablet (5 mg total) by mouth 3 (three) times daily as needed for muscle spasms (back spasms). 40 tablet 1   fluticasone  (FLONASE ) 50 MCG/ACT nasal spray Place 1 spray into both nostrils daily. 15.8 mL 0   meloxicam  (MOBIC ) 15 MG tablet Take 1 tablet (15 mg total) by mouth daily. 30 tablet 0   omeprazole  (PRILOSEC) 40 MG capsule TAKE ONE CAPSULE BY MOUTH EVERY MORNING BEFORE BREAKFAST 90 capsule 3   predniSONE  (DELTASONE ) 10 MG tablet TAKE 60MG  DAILY FOR 7 DAYS, THEN 50MG  DAILY FOR 3 DAYS, THEN 40MG  DAILY FOR 3 DAYS, THEN 30MG  DAILY FOR 3 DAYS, THEN 20MG  DAILY FOR 3 DAYS, THEN 10MG  DAILY FOR 3 DAYS. 87 tablet 0   SUMAtriptan  (IMITREX ) 20 MG/ACT nasal spray Place 1 spray (20 mg total) into the nose as needed for migraine or headache. May repeat in 1 hour.  Maximum 2 doses in 24 hours. 10 each 5   verapamil  (CALAN ) 80 MG tablet Take 1 tablet (80 mg total) by mouth 3 (three) times daily. 90 tablet 2   No current facility-administered medications on file prior to visit.    ALLERGIES: No Known Allergies  FAMILY HISTORY: Family History  Problem Relation Age of Onset   Hypertension Mother    Heart disease Mother        in her 64s   Diabetes Father    Hyperlipidemia Father    Hypertension Father    Healthy Sister    Healthy Brother    Dementia Maternal Grandmother    Other Maternal Grandfather        doesnt know cause of death   Prostate cancer Maternal Grandfather        50s   Heart disease Paternal Grandmother    Other Paternal Grandfather        doesnt know cause of death   Prostate cancer Maternal Uncle        in 54s   Prostate cancer Cousin        in 24s      Objective:  Blood pressure (!) 149/100, pulse 72, height 6' 4 (1.93 m), weight 223 lb (101.2 kg), SpO2 100%. General: No acute distress.  Patient appears well-groomed.   Head:   Normocephalic/atraumatic Eyes:  Fundi examined but not visualized Neck: supple, no paraspinal tenderness, full range of motion Heart:  Regular rate and rhythm Neurological Exam: alert and oriented.  Speech fluent and not dysarthric, language intact.  CN II-XII intact. Bulk and tone normal, muscle strength 5/5 throughout.  Sensation to light touch intact.  Deep tendon reflexes 2+ throughout, toes downgoing.  Finger to nose testing intact.  Gait normal, Romberg negative.   Juliene Dunnings, DO  CC: Garnette Lukes, MD

## 2024-04-25 NOTE — Patient Instructions (Signed)
 Continue prednisone  taper Increase verapamil  80mg  to 1 pill in morning, 1 pill in afternoon and 2 pills at night.  If no improvement in 2 weeks, contact me When you get a migraine: Sumatriptan  nasal spray Lidocaine  nasal spray - 1 spray on affected side.  May repeat in 15 minutes (maximum 2 sprays in 24 hours) Oxygen - 10-15 L/min for 15-20 minutes via nonrebreather face mask Follow up 3 months.

## 2024-05-17 ENCOUNTER — Encounter: Payer: Self-pay | Admitting: Neurology

## 2024-05-17 DIAGNOSIS — G44019 Episodic cluster headache, not intractable: Secondary | ICD-10-CM

## 2024-06-27 ENCOUNTER — Encounter: Payer: Medicaid Other | Admitting: Family Medicine

## 2024-07-01 ENCOUNTER — Ambulatory Visit: Admitting: Family Medicine

## 2024-07-01 ENCOUNTER — Other Ambulatory Visit: Payer: Self-pay

## 2024-07-01 ENCOUNTER — Encounter: Payer: Self-pay | Admitting: Family Medicine

## 2024-07-01 ENCOUNTER — Ambulatory Visit: Payer: Self-pay | Admitting: Family Medicine

## 2024-07-01 VITALS — BP 122/64 | HR 73 | Temp 98.6°F | Ht 76.0 in | Wt 231.4 lb

## 2024-07-01 DIAGNOSIS — Z118 Encounter for screening for other infectious and parasitic diseases: Secondary | ICD-10-CM

## 2024-07-01 DIAGNOSIS — Z113 Encounter for screening for infections with a predominantly sexual mode of transmission: Secondary | ICD-10-CM

## 2024-07-01 DIAGNOSIS — N529 Male erectile dysfunction, unspecified: Secondary | ICD-10-CM | POA: Insufficient documentation

## 2024-07-01 DIAGNOSIS — Z13220 Encounter for screening for lipoid disorders: Secondary | ICD-10-CM

## 2024-07-01 DIAGNOSIS — R6882 Decreased libido: Secondary | ICD-10-CM

## 2024-07-01 DIAGNOSIS — Z130 Encounter for screening for diseases of the blood and blood-forming organs and certain disorders involving the immune mechanism: Secondary | ICD-10-CM

## 2024-07-01 DIAGNOSIS — Z114 Encounter for screening for human immunodeficiency virus [HIV]: Secondary | ICD-10-CM

## 2024-07-01 DIAGNOSIS — Z87891 Personal history of nicotine dependence: Secondary | ICD-10-CM

## 2024-07-01 DIAGNOSIS — E538 Deficiency of other specified B group vitamins: Secondary | ICD-10-CM

## 2024-07-01 DIAGNOSIS — Z0001 Encounter for general adult medical examination with abnormal findings: Secondary | ICD-10-CM

## 2024-07-01 DIAGNOSIS — Z125 Encounter for screening for malignant neoplasm of prostate: Secondary | ICD-10-CM

## 2024-07-01 LAB — CBC WITH DIFFERENTIAL/PLATELET
Basophils Absolute: 0 10*3/uL (ref 0.0–0.1)
Basophils Relative: 0.6 % (ref 0.0–3.0)
Eosinophils Absolute: 0 10*3/uL (ref 0.0–0.7)
Eosinophils Relative: 1 % (ref 0.0–5.0)
HCT: 41.5 % (ref 39.0–52.0)
Hemoglobin: 13.3 g/dL (ref 13.0–17.0)
Lymphocytes Relative: 49.3 % — ABNORMAL HIGH (ref 12.0–46.0)
Lymphs Abs: 1.9 10*3/uL (ref 0.7–4.0)
MCHC: 32 g/dL (ref 30.0–36.0)
MCV: 80.4 fl (ref 78.0–100.0)
Monocytes Absolute: 0.4 10*3/uL (ref 0.1–1.0)
Monocytes Relative: 11.1 % (ref 3.0–12.0)
Neutro Abs: 1.5 10*3/uL (ref 1.4–7.7)
Neutrophils Relative %: 38 % — ABNORMAL LOW (ref 43.0–77.0)
Platelets: 243 10*3/uL (ref 150.0–400.0)
RBC: 5.16 Mil/uL (ref 4.22–5.81)
RDW: 13.6 % (ref 11.5–15.5)
WBC: 3.8 10*3/uL — ABNORMAL LOW (ref 4.0–10.5)

## 2024-07-01 LAB — LIPID PANEL
Cholesterol: 110 mg/dL (ref 28–200)
HDL: 43.1 mg/dL
LDL Cholesterol: 57 mg/dL (ref 10–99)
NonHDL: 66.44
Total CHOL/HDL Ratio: 3
Triglycerides: 48 mg/dL (ref 10.0–149.0)
VLDL: 9.6 mg/dL (ref 0.0–40.0)

## 2024-07-01 LAB — URINALYSIS, ROUTINE W REFLEX MICROSCOPIC
Bilirubin Urine: NEGATIVE
Hgb urine dipstick: NEGATIVE
Ketones, ur: NEGATIVE
Leukocytes,Ua: NEGATIVE
Nitrite: NEGATIVE
RBC / HPF: NONE SEEN
Specific Gravity, Urine: 1.015 (ref 1.000–1.030)
Total Protein, Urine: NEGATIVE
Urine Glucose: NEGATIVE
Urobilinogen, UA: 1 (ref 0.0–1.0)
pH: 6 (ref 5.0–8.0)

## 2024-07-01 LAB — COMPREHENSIVE METABOLIC PANEL WITH GFR
ALT: 23 U/L (ref 3–53)
AST: 17 U/L (ref 5–37)
Albumin: 4.5 g/dL (ref 3.5–5.2)
Alkaline Phosphatase: 63 U/L (ref 39–117)
BUN: 11 mg/dL (ref 6–23)
CO2: 30 meq/L (ref 19–32)
Calcium: 9.7 mg/dL (ref 8.4–10.5)
Chloride: 103 meq/L (ref 96–112)
Creatinine, Ser: 1.16 mg/dL (ref 0.40–1.50)
GFR: 78.19 mL/min
Glucose, Bld: 94 mg/dL (ref 70–99)
Potassium: 4.2 meq/L (ref 3.5–5.1)
Sodium: 140 meq/L (ref 135–145)
Total Bilirubin: 0.5 mg/dL (ref 0.2–1.2)
Total Protein: 7.6 g/dL (ref 6.0–8.3)

## 2024-07-01 LAB — VITAMIN B12: Vitamin B-12: 258 pg/mL (ref 211–911)

## 2024-07-01 LAB — PSA: PSA: 0.78 ng/mL (ref 0.10–4.00)

## 2024-07-01 MED ORDER — TADALAFIL 5 MG PO TABS: 5.0000 mg | ORAL_TABLET | Freq: Every day | ORAL | 11 refills | Status: AC | PRN

## 2024-07-01 NOTE — Assessment & Plan Note (Signed)
#  low libido/erectile dysfunction (ED) issues S: since last visit- reports drop in libido as well as some difficulty maintaining erection or early ejaculate. Bothersome and interested in medication as well as testosterone testing A/P: patient with new onset low libido- needs fasting labs 8-9 am- agrees to come back for this -erectile dysfunction (ED) could be related to low testosterone but we discussed could be confidence issue at his age as well even after just one or two poor experiences- he would like to trial Cialis  but I did counsel him that this could trigger cluster headache(s)- hed still like  to try. Appears to be slightly lower risk than Viagra.

## 2024-07-01 NOTE — Progress Notes (Signed)
 " Phone [phone] In person visit   Subjective:   Ross Ryan is a 44 y.o. 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 (Patient taking differently: Place 1 spray into both nostrils as needed.) 15.8 mL 0   omeprazole  (PRILOSEC) 40 MG capsule TAKE ONE CAPSULE BY MOUTH EVERY MORNING BEFORE BREAKFAST (Patient taking differently: as needed. TAKE ONE CAPSULE BY MOUTH EVERY MORNING BEFORE BREAKFAST) 90 capsule 3   SUMAtriptan  (IMITREX ) 20 MG/ACT nasal spray Place 1 spray (20 mg total) into the nose as needed for migraine or headache. May repeat in 1 hour.  Maximum 2 doses in 24 hours. 10 each 5   tadalafil  (CIALIS ) 5 MG tablet Take 1 tablet (5 mg total) by mouth daily as needed for erectile dysfunction. 30 tablet 11   No current facility-administered medications for this visit.     Objective:  BP 122/64 (BP Location: Left Arm, Patient Position: Sitting, Cuff Size: Normal)   Pulse 73    Temp 98.6 F (37 C) (Temporal)   Ht 6' 4 (1.93 m)   Wt 231 lb 6.4 oz (105 kg)   SpO2 99%   BMI 28.17 kg/m  Gen: NAD, resting comfortably    Assessment and Plan   Erectile dysfunction #low libido/erectile dysfunction (ED) issues S: since last visit- reports drop in libido as well as some difficulty maintaining erection or early ejaculate. Bothersome and interested in medication as well as testosterone testing A/P: patient with new onset low libido- needs fasting labs 8-9 am- agrees to come back for this -erectile dysfunction (ED) could be related to low testosterone but we discussed could be confidence issue at his age as well even after just one or two poor experiences- he would like to trial Cialis  but I did counsel him that this could trigger cluster headache(s)- hed still like  to try. Appears to be slightly lower risk than Viagra.   Recommended follow up: as needed for this new concern Future Appointments  Date Time Provider Department Center  08/18/2024  9:50 AM Skeet Juliene JONELLE, DO [facility] None  02/09/2025 10:10 AM Skeet Juliene JONELLE, DO [facility] None    Lab/Order associations:   ICD-10-CM   1. Low libido  R68.82 Testosterone    2. Erectile dysfunction, unspecified erectile dysfunction type  N52.9 Testosterone    Meds ordered this encounter  Medications   tadalafil  (CIALIS ) 5 MG tablet    Sig:  Take 1 tablet (5 mg total) by mouth daily as needed for erectile dysfunction.    Dispense:  30 tablet    Refill:  11    Return precautions advised.  Garnette Lukes, MD   "

## 2024-07-01 NOTE — Patient Instructions (Addendum)
 Schedule follow up with Dr. Tobie- it looks like he wanted to recheck in 3 months  If Cialis  causes headache(s) please stop the medicine- I am hoping the lower dose is less likely to trigger than the Viagra or similar would  Please stop by lab before you go If you have mychart- we will send your results within 3 business days of us  receiving them.  If you do not have mychart- we will call you about results within 5 business days of us  receiving them.  *please also note that you will see labs on mychart as soon as they post. I will later go in and write notes on them- will say notes from Dr. Katrinka   Tell lab do NOT draw the testosterone today- instead schedule a lab visit fasting has to be between 8-9 am or is not accurate- schedule that at desk  Recommended follow up: Return in about 1 year (around 07/01/2025) for physical or sooner if needed.Schedule b4 you leave.

## 2024-07-01 NOTE — Progress Notes (Signed)
 " Phone: [phone]    Subjective:  Patient presents today for their annual physical. Chief complaint-noted.   See problem oriented charting- ROS- full  review of systems was completed and negative  except for topics noted under acute/chronic concerns  The following were reviewed and entered/updated in epic: Past Medical History:  Diagnosis Date   Anxiety    Hypertension 12/2016   Migraines    Patient Active Problem List   Diagnosis Date Noted   Cluster headache 05/03/2021    Priority: Medium    GAD (generalized anxiety disorder) 05/03/2021    Priority: Medium    Gastroesophageal reflux disease without esophagitis 11/02/2018    Priority: Medium    Major depression in full remission 07/05/2018    Priority: Medium    Hypertension 07/05/2018    Priority: Medium    Throat irritation 07/05/2018    Priority: Low   Weight loss 07/05/2018    Priority: Low   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[facility]  04/02/2017    Priority: Low   Vitamin B12 deficiency - oral repletion  02/02/2017    Priority: Low   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    Priority: Low   Erectile dysfunction 07/01/2024   Loud snoring 05/30/2022   Past Surgical History:  Procedure Laterality Date   Fatty tumor removed from right Posterior forearm Right 2010   KNEE SURGERY Right    arthroscopic- scar tissue removal on acl apparently   tube in left ear Left 2022    Family History  Problem Relation Age of Onset   Hypertension Mother    Heart disease Mother        in her 53s   Vision loss Mother    Diabetes Father    Hyperlipidemia Father    Hypertension Father    Arthritis Father    Healthy Sister    Healthy Brother    Dementia Maternal Grandmother    Other Maternal Grandfather        doesnt know cause of death   Prostate cancer Maternal Grandfather        68s   Heart disease Paternal Grandmother    Other Paternal Grandfather        doesnt know cause of death    Prostate cancer Maternal Uncle        in 44s   Prostate cancer Cousin        in 12s    Medications- reviewed and updated Current Outpatient Medications  Medication Sig Dispense Refill   fluticasone  (FLONASE ) 50 MCG/ACT nasal spray Place 1 spray into both nostrils daily. (Patient taking differently: Place 1 spray into both nostrils as needed.) 15.8 mL 0   omeprazole  (PRILOSEC) 40 MG capsule TAKE ONE CAPSULE BY MOUTH EVERY MORNING BEFORE BREAKFAST (Patient taking differently: as needed. TAKE ONE CAPSULE BY MOUTH EVERY MORNING BEFORE BREAKFAST) 90 capsule 3   SUMAtriptan  (IMITREX ) 20 MG/ACT nasal spray Place 1 spray (20 mg total) into the nose as needed for migraine or headache. May repeat in 1 hour.  Maximum 2 doses in 24 hours. 10 each 5   tadalafil  (CIALIS ) 5 MG tablet Take 1 tablet (5 mg total) by mouth daily as needed for erectile dysfunction. 30 tablet 11   No current facility-administered medications for this visit.    Allergies-reviewed and updated Allergies[1]  Social History   Social History Narrative   Lives alone. 2 kids-  6 and 17 in 2025. 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 Oropharynx normal. Tympanic membrane normal on right, not full view on left Neck: no thyromegaly CV: RRR no murmurs rubs or gallops Lungs: CTAB no crackles, wheeze, rhonchi Abdomen: soft/nontender/nondistended/normal bowel sounds. No rebound or guarding.  Ext: no  edema Skin: warm, dry Neuro: grossly normal, moves all extremities, PERRLA    Assessment and Plan:  44 y.o. male presenting for annual physical.  Health Maintenance counseling: 1. Anticipatory guidance: Patient counseled regarding regular dental exams -q6 months, eye exams - no issues with vision- wants to hold off,  avoiding smoking and second hand smoke , limiting alcohol to 2 beverages per day- bourbon a day, no illicit drugs- some legal THC but not recently.   2. Risk factor reduction:  Advised patient of need for regular exercise and diet rich and fruits and vegetables to reduce risk of heart attack and stroke.  Exercise- out of gym last few weeks with the weather.  Diet/weight management-Down 8 pounds from last physical and reports had already lost 7 pounds before last physical from his peak- got as low as 220 before storms.  Wt Readings from Last 3 Encounters:  07/01/24 231 lb 6.4 oz (105 kg)  04/25/24 223 lb (101.2 kg)  04/20/24 220 lb (99.8 kg)  3. Immunizations/screenings/ancillary studies-declines flu shot  Immunization History  Administered Date(s) Administered   DTaP 02/07/1983, 05/06/1983, 08/22/1983, 04/07/1986, 09/28/1987   Hepatitis A 11/09/2017, 05/12/2018   Hepatitis A, Ped/Adol-2 Dose 11/09/2017, 05/12/2018   Hepatitis B 11/09/2017, 01/08/2018, 05/12/2018   IPV 02/07/1983, 05/06/1983, 08/22/1983, 09/28/1987   MMR 04/16/1984   PPD Test 02/29/2016   Tdap 07/05/2018  4. Prostate cancer screening-we have opted to screen him annually with cousin with prostate cancer in his 82s.  Nocturia once a night stable but had been up to twice a night in the past  Lab Results  Component Value Date   PSA 0.75 06/26/2023   PSA 0.43 10/29/2020   5. Colon cancer screening - no family history, start at age 60 . No blood in stool. He's interested in cologuard 6. Skin cancer screening/prevention- lower risk due to melanin content. advised regular sunscreen use. Denies worrisome, changing,  or new skin lesions.  7. Testicular cancer screening- advised monthly self exams  8. STD screening- patient opted in last year after breaking up with long term girlfriend  - has had unprotected sex and opts to be tested 9. Smoking associated screening- former smoker- mainly cigars- will get UA - thanked him for continuing to be cigar free  Status of chronic or acute concerns   #Episodic cluster headaches-follows with Dr. 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 Tobie with consideration of  left tympanomastoidectomy, possible canal wall down at November 2025 visit with plan for 74-month follow-up- he agrees to call tos chedule  # B12 deficiency S: Current treatment/medication (oral vs. IM): not right now  Lab Results  Component Value Date   VITAMINB12 271 06/26/2023  A/P: he's considering just doing multivitamin- but we will check levels   #gastroesophageal reflux disease- omeprazole  40 mg- doing well lately not even needing- that's great news  #depression/anxiety- he is still working with therapist- well controlled on phq9 and General Anxiety Disorder - 7 question screening survey today    07/01/2024   10:07 AM 02/09/2024   11:26 AM 06/26/2023    1:35 PM  Depression screen PHQ 2/9  Decreased Interest  0 1  Down, Depressed, Hopeless 0 1 1  PHQ - 2 Score 0 1 2  Altered sleeping 0 1 2  Tired, decreased energy 2 0 0  Change in appetite 0 0 0  Feeling bad or failure about yourself  0 0 1  Trouble concentrating 0 0 1  Moving slowly or fidgety/restless 0 0 0  Suicidal  thoughts 0 0 0  PHQ-9 Score 2 2  6    Difficult doing work/chores Not difficult at all Not difficult at all Not difficult at all     Data saved with a previous flowsheet row definition       07/01/2024   10:08 AM 02/09/2024   11:27 AM 06/26/2023    1:35 PM 04/17/2023    3:28 PM  GAD 7 : Generalized Anxiety Score  Nervous, Anxious, on Edge 0 1  0  2   Control/stop worrying 0 1  1  1    Worry too much - different things 1 1  1  1    Trouble relaxing 0 0  0  1   Restless 0 0  0  0   Easily annoyed or irritable 2 0  3  3   Afraid - awful might happen 0 0  0  0   Total GAD 7 Score 3 3 5 8   Anxiety Difficulty Not difficult at all Not difficult at all Not difficult at all      Data saved with a previous flowsheet row definition   #BP- had been running higher with headache(s) - fortunately much better today BP Readings from Last 3 Encounters:  07/01/24 122/64  04/25/24 (!) 149/100  04/20/24 (!) 172/119    Recommended follow up: Return in about 1 year (around 07/01/2025) for physical or sooner if needed.Schedule b4 you leave. Future Appointments  Date Time Provider Department Center  08/18/2024  9:50 AM Skeet Juliene SAUNDERS, DO [facility] None  02/09/2025 10:10 AM Skeet Juliene SAUNDERS, DO [facility] None    Lab/Order associations: fasting   ICD-10-CM   1. Encounter for general adult medical examination with abnormal findings  Z00.01     2. Low libido  R68.82 Testosterone    3. Erectile dysfunction, unspecified erectile dysfunction type  N52.9 Testosterone    4. Screening for gonorrhea  Z11.3 Urine cytology ancillary only    5. Screening for chlamydial disease  Z11.8 Urine cytology ancillary only    6. Former smoker  Z87.891 Urinalysis, Routine w reflex microscopic    7.  Screening examination for venereal disease  Z11.3 RPR    8. Screening for HIV (human immunodeficiency virus)  Z11.4 HIV Antibody (routine testing w rflx)    9. Screening for prostate cancer  Z12.5 PSA    10. Vitamin B12  deficiency - oral repletion   E53.8     11. Screening for hyperlipidemia  Z13.220 Lipid panel    Comprehensive metabolic panel    12. Screening for deficiency anemia  Z13.0 CBC with Differential/Platelet    CANCELED: CBC with Differential/Platelet     Return precautions advised.   Garnette Lukes, MD      [1] No Known Allergies  "

## 2024-07-05 ENCOUNTER — Other Ambulatory Visit

## 2024-08-18 ENCOUNTER — Ambulatory Visit: Admitting: Neurology

## 2025-02-09 ENCOUNTER — Ambulatory Visit: Admitting: Neurology

## 2025-07-03 ENCOUNTER — Encounter: Admitting: Family Medicine
